# Patient Record
Sex: Female | Born: 1960 | Race: White | Hispanic: No | Smoking: Never smoker
Health system: Southern US, Community
[De-identification: ages and names within clinical notes are randomized; demographics above are authoritative.]

## PROBLEM LIST (undated history)

## (undated) DIAGNOSIS — R7303 Prediabetes: Secondary | ICD-10-CM

## (undated) DIAGNOSIS — I471 Supraventricular tachycardia, unspecified: Secondary | ICD-10-CM

## (undated) DIAGNOSIS — T7840XA Allergy, unspecified, initial encounter: Secondary | ICD-10-CM

## (undated) HISTORY — DX: Prediabetes: R73.03

## (undated) HISTORY — PX: OTHER SURGICAL HISTORY: SHX169

## (undated) HISTORY — DX: Supraventricular tachycardia, unspecified: I47.10

## (undated) HISTORY — DX: Allergy, unspecified, initial encounter: T78.40XA

## (undated) HISTORY — PX: BREAST CYST ASPIRATION: SHX578

## (undated) HISTORY — PX: TUBAL LIGATION: SHX77

---

## 2004-04-07 ENCOUNTER — Ambulatory Visit: Payer: Self-pay | Admitting: Unknown Physician Specialty

## 2004-10-01 ENCOUNTER — Ambulatory Visit: Payer: Self-pay | Admitting: Unknown Physician Specialty

## 2005-10-24 ENCOUNTER — Ambulatory Visit: Payer: Self-pay | Admitting: Unknown Physician Specialty

## 2006-11-30 ENCOUNTER — Ambulatory Visit: Payer: Self-pay | Admitting: Unknown Physician Specialty

## 2008-02-14 ENCOUNTER — Ambulatory Visit: Payer: Self-pay | Admitting: Unknown Physician Specialty

## 2009-03-26 ENCOUNTER — Ambulatory Visit: Payer: Self-pay | Admitting: Unknown Physician Specialty

## 2010-01-01 ENCOUNTER — Ambulatory Visit: Payer: Self-pay | Admitting: General Surgery

## 2010-03-29 ENCOUNTER — Ambulatory Visit: Payer: Self-pay | Admitting: Unknown Physician Specialty

## 2011-04-05 ENCOUNTER — Ambulatory Visit: Payer: Self-pay | Admitting: Unknown Physician Specialty

## 2012-09-24 ENCOUNTER — Ambulatory Visit (INDEPENDENT_AMBULATORY_CARE_PROVIDER_SITE_OTHER): Payer: BC Managed Care – PPO | Admitting: Internal Medicine

## 2012-09-24 ENCOUNTER — Encounter: Payer: Self-pay | Admitting: Internal Medicine

## 2012-09-24 VITALS — BP 100/70 | HR 67 | Temp 98.2°F | Ht 66.0 in | Wt 157.0 lb

## 2012-09-24 DIAGNOSIS — N8110 Cystocele, unspecified: Secondary | ICD-10-CM

## 2012-09-24 DIAGNOSIS — J309 Allergic rhinitis, unspecified: Secondary | ICD-10-CM

## 2012-09-24 DIAGNOSIS — Z0000 Encounter for general adult medical examination without abnormal findings: Secondary | ICD-10-CM

## 2012-09-24 DIAGNOSIS — G43909 Migraine, unspecified, not intractable, without status migrainosus: Secondary | ICD-10-CM

## 2012-09-24 DIAGNOSIS — J302 Other seasonal allergic rhinitis: Secondary | ICD-10-CM | POA: Insufficient documentation

## 2012-09-24 DIAGNOSIS — M25579 Pain in unspecified ankle and joints of unspecified foot: Secondary | ICD-10-CM

## 2012-09-24 LAB — CBC WITH DIFFERENTIAL/PLATELET
Eosinophils Absolute: 0.1 10*3/uL (ref 0.0–0.7)
Eosinophils Relative: 1 % (ref 0–5)
HCT: 35.4 % — ABNORMAL LOW (ref 36.0–46.0)
Lymphocytes Relative: 42 % (ref 12–46)
Lymphs Abs: 2 10*3/uL (ref 0.7–4.0)
MCH: 29.6 pg (ref 26.0–34.0)
MCV: 91.2 fL (ref 78.0–100.0)
Monocytes Absolute: 0.6 10*3/uL (ref 0.1–1.0)
Monocytes Relative: 13 % — ABNORMAL HIGH (ref 3–12)
Platelets: 241 10*3/uL (ref 150–400)
RBC: 3.88 MIL/uL (ref 3.87–5.11)
WBC: 4.7 10*3/uL (ref 4.0–10.5)

## 2012-09-24 LAB — MICROALBUMIN / CREATININE URINE RATIO
Creatinine,U: 114.2 mg/dL
Microalb Creat Ratio: 0.4 mg/g (ref 0.0–30.0)

## 2012-09-24 NOTE — Assessment & Plan Note (Signed)
Discussed potential referral to GYN for evaluation and possible use of pessary. Pt would like to hold off for now. Will monitor.

## 2012-09-24 NOTE — Assessment & Plan Note (Signed)
Bilateral heel pain, which is alleviated with ambulation, does not require medication. Suspect that sleep position puts stress on her heels and achilles tendon. Discussed sleeping with pillow under calves, as this has alleviated symptoms of pain in the past. Discussed using supportive footwear. Will monitor for now.

## 2012-09-24 NOTE — Assessment & Plan Note (Signed)
Symptoms well controlled with Claritin. Will continue. 

## 2012-09-24 NOTE — Assessment & Plan Note (Signed)
Symptoms of intermittent migraine headaches, well controlled with use of OTC Excederin Migraine. Will continue to monitor.

## 2012-09-24 NOTE — Progress Notes (Signed)
Subjective:    Patient ID: Claudia Dawson, female    DOB: 26-Sep-1960, 52 y.o.   MRN: [id number]  HPI 51YO female presents to establish care. She has been generally very healthy. Notes it has been a difficult year for her, as her husband passed away unexpectedly in January 17, 2014from MI. She has several concerns today.  Migraine headaches - ongoing for years, described as diffuse throbbing pressure, which occurs once every 3-4 months. Symptoms associated with photo and phonophobia. Occasional nausea. Pt takes Excedrin migraine with relief of symptoms.  Ankle pain - Occurs in the morning, resolves after moving around for 5-7 minutes. No swelling. No trauma to ankles. Able to walk several times per week without pain. Improved with using pillow under calves.  Seasonal allergies - Symptoms of clear nasal drainage and congestion. No fever, chills, cough. No watery eyes. Symptoms improved with use of Claritin.  Vaginal Prolapse - Notes pressure in vaginal area after walking long distances. Occasionally has seen prolapsed tissue. No symptoms of recurrent UTI. Has occasional urinary incontinence with stress such as sneezing. No vaginal or pelvic pain. Ongoing for several years.  Outpatient Encounter Prescriptions as of 09/24/2012  Medication Sig Dispense Refill  . b complex vitamins tablet Take 1 tablet by mouth daily.      . fish oil-omega-3 fatty acids 1000 MG capsule Take 2 g by mouth daily.      . Loratadine 10 MG CAPS Take by mouth.      . Probiotic Product (PROBIOTIC DAILY PO) Take by mouth.       No facility-administered encounter medications on file as of 09/24/2012.   BP 100/70  Pulse 67  Temp(Src) 98.2 F (36.8 C) (Oral)  Ht 5\' 6"  (1.676 m)  Wt 157 lb (71.215 kg)  BMI 25.35 kg/m2  SpO2 98%  Review of Systems  Constitutional: Negative for fever, chills, appetite change, fatigue and unexpected weight change.  HENT: Negative for ear pain, congestion, sore throat, trouble swallowing, neck  pain, voice change and sinus pressure.   Eyes: Negative for visual disturbance.  Respiratory: Negative for cough, shortness of breath, wheezing and stridor.   Cardiovascular: Negative for chest pain, palpitations and leg swelling.  Gastrointestinal: Negative for nausea, vomiting, abdominal pain, diarrhea, constipation, blood in stool, abdominal distention and anal bleeding.  Genitourinary: Negative for dysuria, flank pain, decreased urine volume, vaginal bleeding, vaginal discharge, vaginal pain and pelvic pain.  Musculoskeletal: Positive for arthralgias. Negative for myalgias and gait problem.  Skin: Negative for color change and rash.  Neurological: Positive for headaches. Negative for dizziness.  Hematological: Negative for adenopathy. Does not bruise/bleed easily.  Psychiatric/Behavioral: Negative for suicidal ideas, sleep disturbance and dysphoric mood. The patient is not nervous/anxious.        Objective:   Physical Exam  Constitutional: She is oriented to person, place, and time. She appears well-developed and well-nourished. No distress.  HENT:  Head: Normocephalic and atraumatic.  Right Ear: External ear normal.  Left Ear: External ear normal.  Nose: Nose normal.  Mouth/Throat: Oropharynx is clear and moist. No oropharyngeal exudate.  Eyes: Conjunctivae are normal. Pupils are equal, round, and reactive to light. Right eye exhibits no discharge. Left eye exhibits no discharge. No scleral icterus.  Neck: Normal range of motion. Neck supple. No tracheal deviation present. No thyromegaly present.  Cardiovascular: Normal rate, regular rhythm, normal heart sounds and intact distal pulses.  Exam reveals no gallop and no friction rub.   No murmur heard. Pulmonary/Chest: Effort normal and  breath sounds normal. No accessory muscle usage. Not tachypneic. No respiratory distress. She has no decreased breath sounds. She has no wheezes. She has no rhonchi. She has no rales. She exhibits no  tenderness.  Musculoskeletal: Normal range of motion. She exhibits no edema and no tenderness.       Right ankle: She exhibits normal range of motion and no swelling. No tenderness.       Left ankle: She exhibits normal range of motion and no swelling. No tenderness.  Lymphadenopathy:    She has no cervical adenopathy.  Neurological: She is alert and oriented to person, place, and time. No cranial nerve deficit. She exhibits normal muscle tone. Coordination normal.  Skin: Skin is warm and dry. No rash noted. She is not diaphoretic. No erythema. No pallor.  Psychiatric: She has a normal mood and affect. Her behavior is normal. Judgment and thought content normal.          Assessment & Plan:

## 2012-09-25 ENCOUNTER — Encounter: Payer: Self-pay | Admitting: *Deleted

## 2012-09-25 LAB — LIPID PANEL
LDL Cholesterol: 113 mg/dL — ABNORMAL HIGH (ref 0–99)
Total CHOL/HDL Ratio: 3.1 Ratio
VLDL: 23 mg/dL (ref 0–40)

## 2012-09-25 LAB — COMPREHENSIVE METABOLIC PANEL
ALT: 18 U/L (ref 0–35)
Alkaline Phosphatase: 68 U/L (ref 39–117)
CO2: 24 mEq/L (ref 19–32)
Creat: 0.84 mg/dL (ref 0.50–1.10)
Glucose, Bld: 90 mg/dL (ref 70–99)
Sodium: 140 mEq/L (ref 135–145)
Total Bilirubin: 0.4 mg/dL (ref 0.3–1.2)
Total Protein: 7.3 g/dL (ref 6.0–8.3)

## 2012-09-25 LAB — HEMOGLOBIN A1C: Mean Plasma Glucose: 114 mg/dL (ref <117)

## 2012-09-26 ENCOUNTER — Ambulatory Visit: Payer: Self-pay | Admitting: Internal Medicine

## 2012-09-28 ENCOUNTER — Encounter: Payer: Self-pay | Admitting: Internal Medicine

## 2012-10-03 ENCOUNTER — Telehealth: Payer: Self-pay | Admitting: Internal Medicine

## 2012-10-03 NOTE — Telephone Encounter (Signed)
Patient informed and she will have her pap done when some comes in September

## 2012-10-03 NOTE — Telephone Encounter (Signed)
PAP from 2012 from [facility] showed ASCUS and HPV neg. Has this been repeated? If not, we need to schedule repeat PAP here.

## 2012-10-03 NOTE — Telephone Encounter (Signed)
Left message to cal back 

## 2012-10-31 ENCOUNTER — Encounter: Payer: BC Managed Care – PPO | Admitting: Internal Medicine

## 2012-11-06 ENCOUNTER — Other Ambulatory Visit (HOSPITAL_COMMUNITY)
Admission: RE | Admit: 2012-11-06 | Discharge: 2012-11-06 | Disposition: A | Discharge status: Home / Self Care | Payer: BC Managed Care – PPO | Source: Ambulatory Visit | Attending: Internal Medicine | Admitting: Internal Medicine

## 2012-11-06 ENCOUNTER — Ambulatory Visit (INDEPENDENT_AMBULATORY_CARE_PROVIDER_SITE_OTHER): Payer: BC Managed Care – PPO | Admitting: Internal Medicine

## 2012-11-06 ENCOUNTER — Encounter: Payer: Self-pay | Admitting: Internal Medicine

## 2012-11-06 VITALS — BP 100/70 | HR 65 | Temp 98.4°F | Ht 66.0 in | Wt 160.0 lb

## 2012-11-06 DIAGNOSIS — Z01419 Encounter for gynecological examination (general) (routine) without abnormal findings: Secondary | ICD-10-CM | POA: Insufficient documentation

## 2012-11-06 DIAGNOSIS — Z124 Encounter for screening for malignant neoplasm of cervix: Secondary | ICD-10-CM

## 2012-11-06 DIAGNOSIS — Z0000 Encounter for general adult medical examination without abnormal findings: Secondary | ICD-10-CM

## 2012-11-06 DIAGNOSIS — Z1151 Encounter for screening for human papillomavirus (HPV): Secondary | ICD-10-CM | POA: Insufficient documentation

## 2012-11-07 DIAGNOSIS — Z124 Encounter for screening for malignant neoplasm of cervix: Secondary | ICD-10-CM | POA: Insufficient documentation

## 2012-11-07 DIAGNOSIS — Z0000 Encounter for general adult medical examination without abnormal findings: Secondary | ICD-10-CM | POA: Insufficient documentation

## 2012-11-07 NOTE — Assessment & Plan Note (Addendum)
General medical exam including breast and pelvic exam normal today. Pap is pending. Encourage continued efforts at healthy diet and regular physical activity. Mammogram is up-to-date. Colonoscopy up-to-date. Reviewed recent labs which were normal. Followup one year or sooner as needed.

## 2012-11-07 NOTE — Progress Notes (Signed)
Subjective:    Patient ID: Claudia Dawson, female    DOB: Jul 14, 1960, 52 y.o.   MRN: [id number]  HPI 52 year old female presents for annual exam. She reports she is feeling well. She continues to follow a healthy diet and gets regular physical activity. She is up-to-date on mammogram and colonoscopy. She denies any concerns today. She notes that she may be a kidney donor for her cousin who has cystic fibrosis and kidney failure.  Outpatient Encounter Prescriptions as of 11/06/2012  Medication Sig Dispense Refill  . b complex vitamins tablet Take 1 tablet by mouth daily.      . fish oil-omega-3 fatty acids 1000 MG capsule Take 2 g by mouth daily.      . Loratadine 10 MG CAPS Take by mouth.       No facility-administered encounter medications on file as of 11/06/2012.   BP 100/70  Pulse 65  Temp(Src) 98.4 F (36.9 C) (Oral)  Ht 5\' 6"  (1.676 m)  Wt 160 lb (72.576 kg)  BMI 25.84 kg/m2  SpO2 97%  Review of Systems  Constitutional: Negative for fever, chills, appetite change, fatigue and unexpected weight change.  HENT: Negative for ear pain, congestion, sore throat, trouble swallowing, neck pain, voice change and sinus pressure.   Eyes: Negative for visual disturbance.  Respiratory: Negative for cough, shortness of breath, wheezing and stridor.   Cardiovascular: Negative for chest pain, palpitations and leg swelling.  Gastrointestinal: Negative for nausea, vomiting, abdominal pain, diarrhea, constipation, blood in stool, abdominal distention and anal bleeding.  Genitourinary: Negative for dysuria and flank pain.  Musculoskeletal: Negative for myalgias, arthralgias and gait problem.  Skin: Negative for color change and rash.  Neurological: Negative for dizziness and headaches.  Hematological: Negative for adenopathy. Does not bruise/bleed easily.  Psychiatric/Behavioral: Negative for suicidal ideas, sleep disturbance and dysphoric mood. The patient is not nervous/anxious.         Objective:   Physical Exam  Constitutional: She is oriented to person, place, and time. She appears well-developed and well-nourished. No distress.  HENT:  Head: Normocephalic and atraumatic.  Right Ear: External ear normal.  Left Ear: External ear normal.  Nose: Nose normal.  Mouth/Throat: Oropharynx is clear and moist. No oropharyngeal exudate.  Eyes: Conjunctivae are normal. Pupils are equal, round, and reactive to light. Right eye exhibits no discharge. Left eye exhibits no discharge. No scleral icterus.  Neck: Normal range of motion. Neck supple. No tracheal deviation present. No thyromegaly present.  Cardiovascular: Normal rate, regular rhythm, normal heart sounds and intact distal pulses.  Exam reveals no gallop and no friction rub.   No murmur heard. Pulmonary/Chest: Effort normal and breath sounds normal. No accessory muscle usage. Not tachypneic. No respiratory distress. She has no decreased breath sounds. She has no wheezes. She has no rhonchi. She has no rales. She exhibits no tenderness.  Abdominal: Soft. Bowel sounds are normal. She exhibits no distension and no mass. There is no tenderness. There is no rebound and no guarding.  Genitourinary: Rectum normal, vagina normal and uterus normal. No breast swelling, tenderness, discharge or bleeding. Pelvic exam was performed with patient supine. There is no rash, tenderness or lesion on the right labia. There is no rash, tenderness or lesion on the left labia. Uterus is not enlarged and not tender. Cervix exhibits no motion tenderness, no discharge and no friability. Right adnexum displays no mass, no tenderness and no fullness. Left adnexum displays no mass, no tenderness and no fullness. No erythema or tenderness  around the vagina. No vaginal discharge found.  Musculoskeletal: Normal range of motion. She exhibits no edema and no tenderness.  Lymphadenopathy:    She has no cervical adenopathy.  Neurological: She is alert and oriented  to person, place, and time. No cranial nerve deficit. She exhibits normal muscle tone. Coordination normal.  Skin: Skin is warm and dry. No rash noted. She is not diaphoretic. No erythema. No pallor.  Psychiatric: She has a normal mood and affect. Her behavior is normal. Judgment and thought content normal.          Assessment & Plan:

## 2012-11-08 LAB — HM PAP SMEAR: HM PAP: NEGATIVE

## 2012-12-20 ENCOUNTER — Other Ambulatory Visit: Payer: Self-pay

## 2013-11-08 ENCOUNTER — Encounter: Payer: Self-pay | Admitting: Internal Medicine

## 2013-11-08 ENCOUNTER — Ambulatory Visit: Payer: Self-pay | Admitting: Internal Medicine

## 2013-11-08 ENCOUNTER — Ambulatory Visit (INDEPENDENT_AMBULATORY_CARE_PROVIDER_SITE_OTHER): Payer: BC Managed Care – PPO | Admitting: Internal Medicine

## 2013-11-08 VITALS — BP 100/56 | HR 69 | Temp 98.1°F | Ht 67.0 in | Wt 166.5 lb

## 2013-11-08 DIAGNOSIS — Z23 Encounter for immunization: Secondary | ICD-10-CM

## 2013-11-08 DIAGNOSIS — N63 Unspecified lump in unspecified breast: Secondary | ICD-10-CM | POA: Insufficient documentation

## 2013-11-08 DIAGNOSIS — Z0000 Encounter for general adult medical examination without abnormal findings: Secondary | ICD-10-CM

## 2013-11-08 DIAGNOSIS — R928 Other abnormal and inconclusive findings on diagnostic imaging of breast: Secondary | ICD-10-CM

## 2013-11-08 HISTORY — DX: Unspecified lump in unspecified breast: N63.0

## 2013-11-08 LAB — MICROALBUMIN / CREATININE URINE RATIO
Creatinine,U: 37 mg/dL
MICROALB UR: 0.2 mg/dL (ref 0.0–1.9)
MICROALB/CREAT RATIO: 0.5 mg/g (ref 0.0–30.0)

## 2013-11-08 LAB — LIPID PANEL
CHOL/HDL RATIO: 3
Cholesterol: 216 mg/dL — ABNORMAL HIGH (ref 0–200)
HDL: 62.3 mg/dL (ref >39.00)
LDL Cholesterol: 133 mg/dL — ABNORMAL HIGH (ref 0–99)
NONHDL: 153.7
Triglycerides: 102 mg/dL (ref 0.0–149.0)
VLDL: 20.4 mg/dL (ref 0.0–40.0)

## 2013-11-08 LAB — COMPREHENSIVE METABOLIC PANEL
ALT: 20 U/L (ref 0–35)
AST: 20 U/L (ref 0–37)
Albumin: 4.1 g/dL (ref 3.5–5.2)
Alkaline Phosphatase: 75 U/L (ref 39–117)
BUN: 13 mg/dL (ref 6–23)
CALCIUM: 9.4 mg/dL (ref 8.4–10.5)
CHLORIDE: 105 meq/L (ref 96–112)
CO2: 27 meq/L (ref 19–32)
Creatinine, Ser: 0.8 mg/dL (ref 0.4–1.2)
GFR: 78.34 mL/min (ref >60.00)
Glucose, Bld: 91 mg/dL (ref 70–99)
Potassium: 4.1 mEq/L (ref 3.5–5.1)
SODIUM: 139 meq/L (ref 135–145)
TOTAL PROTEIN: 7.1 g/dL (ref 6.0–8.3)
Total Bilirubin: 0.7 mg/dL (ref 0.2–1.2)

## 2013-11-08 LAB — CBC WITH DIFFERENTIAL/PLATELET
BASOS ABS: 0 10*3/uL (ref 0.0–0.1)
Basophils Relative: 0.5 % (ref 0.0–3.0)
EOS ABS: 0.1 10*3/uL (ref 0.0–0.7)
Eosinophils Relative: 1 % (ref 0.0–5.0)
HCT: 38.9 % (ref 36.0–46.0)
Hemoglobin: 12.8 g/dL (ref 12.0–15.0)
LYMPHS PCT: 39.5 % (ref 12.0–46.0)
Lymphs Abs: 2.1 10*3/uL (ref 0.7–4.0)
MCHC: 32.9 g/dL (ref 30.0–36.0)
MCV: 92.2 fl (ref 78.0–100.0)
MONOS PCT: 11.4 % (ref 3.0–12.0)
Monocytes Absolute: 0.6 10*3/uL (ref 0.1–1.0)
NEUTROS PCT: 47.6 % (ref 43.0–77.0)
Neutro Abs: 2.6 10*3/uL (ref 1.4–7.7)
PLATELETS: 217 10*3/uL (ref 150.0–400.0)
RBC: 4.22 Mil/uL (ref 3.87–5.11)
RDW: 13.5 % (ref 11.5–15.5)
WBC: 5.4 10*3/uL (ref 4.0–10.5)

## 2013-11-08 LAB — TSH: TSH: 1.66 u[IU]/mL (ref 0.35–4.50)

## 2013-11-08 LAB — HEMOGLOBIN A1C: Hgb A1c MFr Bld: 5.9 % (ref 4.6–6.5)

## 2013-11-08 LAB — VITAMIN D 25 HYDROXY (VIT D DEFICIENCY, FRACTURES): VITD: 27.8 ng/mL — AB (ref 30.00–100.00)

## 2013-11-08 NOTE — Patient Instructions (Signed)

## 2013-11-08 NOTE — Addendum Note (Signed)
Addended by: Marchia Meiers on: 11/08/2013 09:59 AM   Modules accepted: Orders

## 2013-11-08 NOTE — Assessment & Plan Note (Signed)
Pt with left breast nodular area. Reports this has been followed by Dr. Lemar Livings and aspirated in the past. Follow up mammogram scheduled for today.

## 2013-11-08 NOTE — Progress Notes (Signed)
Subjective:    Patient ID: Claudia Dawson, female    DOB: 1960-05-26, 53 y.o.   MRN: [id number]  HPI 52YO female presents for annual exam. Trying to lose weight following a healthy diet and exercising with cardio and weights. Recently started a new exercise program through church. Very active with walking and biking. Back at work in school. She notes that nodular area in left breast has been stable for several years. S/p aspiration by Dr. Lemar Livings in the past.  Review of Systems  Constitutional: Negative for fever, chills, appetite change, fatigue and unexpected weight change.  HENT: Positive for congestion.   Eyes: Negative for visual disturbance.  Respiratory: Negative for shortness of breath.   Cardiovascular: Negative for chest pain and leg swelling.  Gastrointestinal: Negative for nausea, vomiting, abdominal pain, diarrhea, constipation and rectal pain.  Skin: Negative for color change and rash.  Hematological: Negative for adenopathy. Does not bruise/bleed easily.  Psychiatric/Behavioral: Negative for dysphoric mood. The patient is not nervous/anxious.        Objective:    BP 100/56  Pulse 69  Temp(Src) 98.1 F (36.7 C) (Oral)  Ht  (1.702 m)  Wt 166 lb 8 oz (75.524 kg)  BMI 26.07 kg/m2  SpO2 97% Physical Exam  Constitutional: She is oriented to person, place, and time. She appears well-developed and well-nourished. No distress.  HENT:  Head: Normocephalic and atraumatic.  Right Ear: External ear normal.  Left Ear: External ear normal.  Nose: Nose normal.  Mouth/Throat: Oropharynx is clear and moist. No oropharyngeal exudate.  Eyes: Conjunctivae are normal. Pupils are equal, round, and reactive to light. Right eye exhibits no discharge. Left eye exhibits no discharge. No scleral icterus.  Neck: Normal range of motion. Neck supple. No tracheal deviation present. No thyromegaly present.  Cardiovascular: Normal rate, regular rhythm, normal heart sounds  and intact distal pulses.  Exam reveals no gallop and no friction rub.   No murmur heard. Pulmonary/Chest: Effort normal and breath sounds normal. No accessory muscle usage. Not tachypneic. No respiratory distress. She has no decreased breath sounds. She has no wheezes. She has no rales. She exhibits no tenderness. Right breast exhibits no inverted nipple, no mass, no nipple discharge, no skin change and no tenderness. Left breast exhibits mass. Left breast exhibits no inverted nipple, no nipple discharge, no skin change and no tenderness. Breasts are symmetrical.    Abdominal: Soft. Bowel sounds are normal. She exhibits no distension and no mass. There is no tenderness. There is no rebound and no guarding.  Musculoskeletal: Normal range of motion. She exhibits no edema and no tenderness.  Lymphadenopathy:    She has no cervical adenopathy.  Neurological: She is alert and oriented to person, place, and time. No cranial nerve deficit. She exhibits normal muscle tone. Coordination normal.  Skin: Skin is warm and dry. No rash noted. She is not diaphoretic. No erythema. No pallor.  Psychiatric: She has a normal mood and affect. Her behavior is normal. Judgment and thought content normal.          Assessment & Plan:   Problem List Items Addressed This Visit     Unprioritized   Breast nodule     Pt with left breast nodular area. Reports this has been followed by Dr. Lemar Livings and aspirated in the past. Follow up mammogram scheduled for today.     Routine general medical examination at a health care facility - Primary     General medical exam normal  today except as noted on breast exam. PAP and pelvic deferred as PAP normal, HPV neg in 2014. Plan repeat PAP in 2017. Mammogram scheduled for today. Colonoscopy UTD 2012, will request report on this. Labs today including CBC, CMP, lipids, TSH, A1c. Encouraged healthy diet and exercise. Flu and TdaP vaccines today.    Relevant Orders      TSH       CBC with Differential      Comprehensive metabolic panel      Lipid panel      Microalbumin / creatinine urine ratio      Vit D  25 hydroxy (rtn osteoporosis monitoring)      Hemoglobin A1c       Return in about 1 year (around 11/09/2014) for Physical.

## 2013-11-08 NOTE — Progress Notes (Signed)
Pre visit review using our clinic review tool, if applicable. No additional management support is needed unless otherwise documented below in the visit note. 

## 2013-11-08 NOTE — Assessment & Plan Note (Addendum)
General medical exam normal today except as noted on breast exam. PAP and pelvic deferred as PAP normal, HPV neg in 2014. Plan repeat PAP in 2017. Mammogram scheduled for today. Colonoscopy UTD 2012, will request report on this. Labs today including CBC, CMP, lipids, TSH, A1c. Encouraged healthy diet and exercise. Flu and TdaP vaccines today.

## 2013-11-09 ENCOUNTER — Telehealth: Payer: Self-pay | Admitting: Internal Medicine

## 2013-11-09 NOTE — Telephone Encounter (Signed)
Results from [facility]ogram showed possible density in left breast. They have requested additional views. Has this been scheduled?

## 2013-11-12 NOTE — Telephone Encounter (Signed)
Left message for pt to return my call.

## 2013-11-12 NOTE — Telephone Encounter (Signed)
Pt has spoke with [facility] and has appt set with them

## 2013-11-27 ENCOUNTER — Ambulatory Visit: Payer: Self-pay | Admitting: Internal Medicine

## 2013-12-11 ENCOUNTER — Encounter: Payer: Self-pay | Admitting: Internal Medicine

## 2013-12-26 ENCOUNTER — Encounter: Payer: Self-pay | Admitting: Internal Medicine

## 2015-01-07 ENCOUNTER — Telehealth: Payer: Self-pay | Admitting: Internal Medicine

## 2015-01-07 DIAGNOSIS — Z0000 Encounter for general adult medical examination without abnormal findings: Secondary | ICD-10-CM

## 2015-01-07 NOTE — Telephone Encounter (Signed)
Pt would like to get lab work done before her physical appt on 02/12/2015. Orders needed please and Thank You!

## 2015-02-12 ENCOUNTER — Ambulatory Visit (INDEPENDENT_AMBULATORY_CARE_PROVIDER_SITE_OTHER): Payer: BC Managed Care – PPO | Admitting: Internal Medicine

## 2015-02-12 ENCOUNTER — Encounter: Payer: Self-pay | Admitting: Internal Medicine

## 2015-02-12 VITALS — BP 109/66 | HR 58 | Temp 98.0°F | Ht 65.75 in | Wt 156.0 lb

## 2015-02-12 DIAGNOSIS — Z23 Encounter for immunization: Secondary | ICD-10-CM

## 2015-02-12 DIAGNOSIS — Z0000 Encounter for general adult medical examination without abnormal findings: Secondary | ICD-10-CM

## 2015-02-12 DIAGNOSIS — Z1239 Encounter for other screening for malignant neoplasm of breast: Secondary | ICD-10-CM | POA: Insufficient documentation

## 2015-02-12 LAB — CBC WITH DIFFERENTIAL/PLATELET
BASOS ABS: 0 10*3/uL (ref 0.0–0.1)
Basophils Relative: 0.3 % (ref 0.0–3.0)
EOS ABS: 0 10*3/uL (ref 0.0–0.7)
Eosinophils Relative: 0.8 % (ref 0.0–5.0)
HEMATOCRIT: 38.3 % (ref 36.0–46.0)
Hemoglobin: 12.6 g/dL (ref 12.0–15.0)
LYMPHS PCT: 46.7 % — AB (ref 12.0–46.0)
Lymphs Abs: 2.1 10*3/uL (ref 0.7–4.0)
MCHC: 32.8 g/dL (ref 30.0–36.0)
MCV: 90 fl (ref 78.0–100.0)
Monocytes Absolute: 0.5 10*3/uL (ref 0.1–1.0)
Monocytes Relative: 10.4 % (ref 3.0–12.0)
NEUTROS ABS: 1.9 10*3/uL (ref 1.4–7.7)
Neutrophils Relative %: 41.8 % — ABNORMAL LOW (ref 43.0–77.0)
PLATELETS: 227 10*3/uL (ref 150.0–400.0)
RBC: 4.26 Mil/uL (ref 3.87–5.11)
RDW: 14.1 % (ref 11.5–15.5)
WBC: 4.5 10*3/uL (ref 4.0–10.5)

## 2015-02-12 LAB — COMPREHENSIVE METABOLIC PANEL
ALT: 17 U/L (ref 0–35)
AST: 18 U/L (ref 0–37)
Albumin: 4.1 g/dL (ref 3.5–5.2)
Alkaline Phosphatase: 75 U/L (ref 39–117)
BILIRUBIN TOTAL: 0.5 mg/dL (ref 0.2–1.2)
BUN: 13 mg/dL (ref 6–23)
CALCIUM: 9.2 mg/dL (ref 8.4–10.5)
CO2: 30 meq/L (ref 19–32)
CREATININE: 0.65 mg/dL (ref 0.40–1.20)
Chloride: 103 mEq/L (ref 96–112)
GFR: 100.52 mL/min (ref >60.00)
GLUCOSE: 91 mg/dL (ref 70–99)
Potassium: 4.4 mEq/L (ref 3.5–5.1)
Sodium: 139 mEq/L (ref 135–145)
TOTAL PROTEIN: 7 g/dL (ref 6.0–8.3)

## 2015-02-12 LAB — LIPID PANEL
CHOL/HDL RATIO: 4
Cholesterol: 191 mg/dL (ref 0–200)
HDL: 53.9 mg/dL (ref >39.00)
LDL Cholesterol: 111 mg/dL — ABNORMAL HIGH (ref 0–99)
NONHDL: 137.51
TRIGLYCERIDES: 132 mg/dL (ref 0.0–149.0)
VLDL: 26.4 mg/dL (ref 0.0–40.0)

## 2015-02-12 LAB — VITAMIN D 25 HYDROXY (VIT D DEFICIENCY, FRACTURES): VITD: 28.17 ng/mL — ABNORMAL LOW (ref 30.00–100.00)

## 2015-02-12 LAB — TSH: TSH: 1.43 u[IU]/mL (ref 0.35–4.50)

## 2015-02-12 NOTE — Progress Notes (Signed)
Subjective:    Patient ID: Rolly Pancakeindy Carroll Yingst, female    DOB: 10-18-1960, 54 y.o.   MRN: [id number]  HPI  54YO female presents for annual exam.  Recently sick with congestion, fever. Lasted about 24hr, then developed hoarseness. Taking Mucinex with improvement.  Aside from this, feeling well.   She continues to have some intermittent vaginal prolapse, which occurs with heavy lifting. She does not find this bothersome. She is able to apply pressure to push vaginal wall back internally. She prefers to hold off on considering pessary or surgical intervention.  Wt Readings from Last 3 Encounters:  02/12/15 156 lb (70.761 kg)  11/08/13 166 lb 8 oz (75.524 kg)  11/06/12 160 lb (72.576 kg)   BP Readings from Last 3 Encounters:  02/12/15 109/66  11/08/13 100/56  11/06/12 100/70    Past Medical History  Diagnosis Date  . Allergy    Family History  Problem Relation Age of Onset  . Hypertension Father   . Atrial fibrillation Father   . Heart disease Father     afib  . Ulcers Father   . Cancer Maternal Grandmother     Endometrial  . Stroke Paternal Grandmother   . Cancer Maternal Aunt     half-aunt breast cancer  . Cancer Brother     kidney, unsure if malignant tumor   Past Surgical History  Procedure Laterality Date  . Cesarean section    . Tubal ligation     Social History   Social History  . Marital Status: Married    Spouse Name: N/A  . Number of Children: N/A  . Years of Education: N/A   Social History Main Topics  . Smoking status: Never Smoker   . Smokeless tobacco: Never Used  . Alcohol Use: No  . Drug Use: No  . Sexual Activity: Not Asked   Other Topics Concern  . None   Social History Narrative   [place] in [place]. Husband died of MI at age 55 in Jan 2014. Daughters live nearby.      Work - Geologist, engineeringteacher assistant      Diet - regular diet      Exercise - walks and lifts weights with group from church    Review of Systems    Constitutional: Negative for fever, chills, appetite change, fatigue and unexpected weight change.  HENT: Positive for congestion, postnasal drip, rhinorrhea and voice change. Negative for ear pain, sore throat and trouble swallowing.   Eyes: Negative for visual disturbance.  Respiratory: Negative for cough, chest tightness and shortness of breath.   Cardiovascular: Negative for chest pain and leg swelling.  Gastrointestinal: Negative for nausea, vomiting, abdominal pain, diarrhea and constipation.  Musculoskeletal: Negative for myalgias and arthralgias.  Skin: Negative for color change and rash.  Hematological: Negative for adenopathy. Does not bruise/bleed easily.  Psychiatric/Behavioral: Negative for sleep disturbance and dysphoric mood. The patient is not nervous/anxious.        Objective:    BP 109/66 mmHg  Pulse 58  Temp(Src) 98 F (36.7 C) (Oral)  Ht 5' 5.75" (1.67 m)  Wt 156 lb (70.761 kg)  BMI 25.37 kg/m2  SpO2 100% Physical Exam  Constitutional: She is oriented to person, place, and time. She appears well-developed and well-nourished. No distress.  HENT:  Head: Normocephalic and atraumatic.  Right Ear: External ear normal.  Left Ear: External ear normal.  Nose: Nose normal.  Mouth/Throat: Oropharynx is clear and moist. No oropharyngeal exudate.  Eyes: Conjunctivae are  normal. Pupils are equal, round, and reactive to light. Right eye exhibits no discharge. Left eye exhibits no discharge. No scleral icterus.  Neck: Normal range of motion. Neck supple. No tracheal deviation present. No thyromegaly present.  Cardiovascular: Normal rate, regular rhythm, normal heart sounds and intact distal pulses.  Exam reveals no gallop and no friction rub.   No murmur heard. Pulmonary/Chest: Effort normal and breath sounds normal. No accessory muscle usage. No tachypnea. No respiratory distress. She has no decreased breath sounds. She has no wheezes. She has no rales. She exhibits no  tenderness. Right breast exhibits no inverted nipple, no mass, no nipple discharge, no skin change and no tenderness. Left breast exhibits no inverted nipple, no mass, no nipple discharge, no skin change and no tenderness. Breasts are symmetrical.  Abdominal: Soft. Bowel sounds are normal. She exhibits no distension and no mass. There is no tenderness. There is no rebound and no guarding.  Musculoskeletal: Normal range of motion. She exhibits no edema or tenderness.  Lymphadenopathy:    She has no cervical adenopathy.  Neurological: She is alert and oriented to person, place, and time. No cranial nerve deficit. She exhibits normal muscle tone. Coordination normal.  Skin: Skin is warm and dry. No rash noted. She is not diaphoretic. No erythema. No pallor.  Psychiatric: She has a normal mood and affect. Her behavior is normal. Judgment and thought content normal.          Assessment & Plan:   Problem List Items Addressed This Visit      Unprioritized   Routine general medical examination at a health care facility - Primary    General medical exam normal today including breast exam. PAP and pelvic deferred as normal, HPV neg in 2014, plan repeat in 2017. Mammogram ordered. Colonoscopy UTD. Labs ordered.  Flu vaccine today. Encouraged healthy diet and exercise.      Relevant Orders   CBC with Differential/Platelet   Comprehensive metabolic panel   Lipid panel   VITAMIN D 25 Hydroxy (Vit-D Deficiency, Fractures)   TSH   Hepatitis C antibody   Screening for breast cancer   Relevant Orders   MM Digital Screening       Return in about 1 year (around 02/12/2016) for Physical.

## 2015-02-12 NOTE — Assessment & Plan Note (Signed)
General medical exam normal today including breast exam. PAP and pelvic deferred as normal, HPV neg in 2014, plan repeat in 2017. Mammogram ordered. Colonoscopy UTD. Labs ordered.  Flu vaccine today. Encouraged healthy diet and exercise.

## 2015-02-12 NOTE — Patient Instructions (Signed)
Health Maintenance, Female Adopting a healthy lifestyle and getting preventive care can go a long way to promote health and wellness. Talk with your health care provider about what schedule of regular examinations is right for you. This is a good chance for you to check in with your provider about disease prevention and staying healthy. In between checkups, there are plenty of things you can do on your own. Experts have done a lot of research about which lifestyle changes and preventive measures are most likely to keep you healthy. Ask your health care provider for more information. WEIGHT AND DIET  Eat a healthy diet  Be sure to include plenty of vegetables, fruits, low-fat dairy products, and lean protein.  Do not eat a lot of foods high in solid fats, added sugars, or salt.  Get regular exercise. This is one of the most important things you can do for your health.  Most adults should exercise for at least 150 minutes each week. The exercise should increase your heart rate and make you sweat (moderate-intensity exercise).  Most adults should also do strengthening exercises at least twice a week. This is in addition to the moderate-intensity exercise.  Maintain a healthy weight  Body mass index (BMI) is a measurement that can be used to identify possible weight problems. It estimates body fat based on height and weight. Your health care provider can help determine your BMI and help you achieve or maintain a healthy weight.  For females 20 years of age and older:   A BMI below 18.5 is considered underweight.  A BMI of 18.5 to 24.9 is normal.  A BMI of 25 to 29.9 is considered overweight.  A BMI of 30 and above is considered obese.  Watch levels of cholesterol and blood lipids  You should start having your blood tested for lipids and cholesterol at 55 years of age, then have this test every 5 years.  You may need to have your cholesterol levels checked more often if:  Your lipid  or cholesterol levels are high.  You are older than 55 years of age.  You are at high risk for heart disease.  CANCER SCREENING   Lung Cancer  Lung cancer screening is recommended for adults 55-80 years old who are at high risk for lung cancer because of a history of smoking.  A yearly low-dose CT scan of the lungs is recommended for people who:  Currently smoke.  Have quit within the past 15 years.  Have at least a 30-pack-year history of smoking. A pack year is smoking an average of one pack of cigarettes a day for 1 year.  Yearly screening should continue until it has been 15 years since you quit.  Yearly screening should stop if you develop a health problem that would prevent you from having lung cancer treatment.  Breast Cancer  Practice breast self-awareness. This means understanding how your breasts normally appear and feel.  It also means doing regular breast self-exams. Let your health care provider know about any changes, no matter how small.  If you are in your 20s or 30s, you should have a clinical breast exam (CBE) by a health care provider every 1-3 years as part of a regular health exam.  If you are 40 or older, have a CBE every year. Also consider having a breast X-ray (mammogram) every year.  If you have a family history of breast cancer, talk to your health care provider about genetic screening.  If you   are at high risk for breast cancer, talk to your health care provider about having an MRI and a mammogram every year.  Breast cancer gene (BRCA) assessment is recommended for women who have family members with BRCA-related cancers. BRCA-related cancers include:  Breast.  Ovarian.  Tubal.  Peritoneal cancers.  Results of the assessment will determine the need for genetic counseling and BRCA1 and BRCA2 testing. Cervical Cancer Your health care provider may recommend that you be screened regularly for cancer of the pelvic organs (ovaries, uterus, and  vagina). This screening involves a pelvic examination, including checking for microscopic changes to the surface of your cervix (Pap test). You may be encouraged to have this screening done every 3 years, beginning at age 21.  For women ages 30-65, health care providers may recommend pelvic exams and Pap testing every 3 years, or they may recommend the Pap and pelvic exam, combined with testing for human papilloma virus (HPV), every 5 years. Some types of HPV increase your risk of cervical cancer. Testing for HPV may also be done on women of any age with unclear Pap test results.  Other health care providers may not recommend any screening for nonpregnant women who are considered low risk for pelvic cancer and who do not have symptoms. Ask your health care provider if a screening pelvic exam is right for you.  If you have had past treatment for cervical cancer or a condition that could lead to cancer, you need Pap tests and screening for cancer for at least 20 years after your treatment. If Pap tests have been discontinued, your risk factors (such as having a new sexual partner) need to be reassessed to determine if screening should resume. Some women have medical problems that increase the chance of getting cervical cancer. In these cases, your health care provider may recommend more frequent screening and Pap tests. Colorectal Cancer  This type of cancer can be detected and often prevented.  Routine colorectal cancer screening usually begins at 55 years of age and continues through 55 years of age.  Your health care provider may recommend screening at an earlier age if you have risk factors for colon cancer.  Your health care provider may also recommend using home test kits to check for hidden blood in the stool.  A small camera at the end of a tube can be used to examine your colon directly (sigmoidoscopy or colonoscopy). This is done to check for the earliest forms of colorectal  cancer.  Routine screening usually begins at age 50.  Direct examination of the colon should be repeated every 5-10 years through 55 years of age. However, you may need to be screened more often if early forms of precancerous polyps or small growths are found. Skin Cancer  Check your skin from head to toe regularly.  Tell your health care provider about any new moles or changes in moles, especially if there is a change in a mole's shape or color.  Also tell your health care provider if you have a mole that is larger than the size of a pencil eraser.  Always use sunscreen. Apply sunscreen liberally and repeatedly throughout the day.  Protect yourself by wearing long sleeves, pants, a wide-brimmed hat, and sunglasses whenever you are outside. HEART DISEASE, DIABETES, AND HIGH BLOOD PRESSURE   High blood pressure causes heart disease and increases the risk of stroke. High blood pressure is more likely to develop in:  People who have blood pressure in the high end   of the normal range (130-139/85-89 mm Hg).  People who are overweight or obese.  People who are African American.  If you are 38-23 years of age, have your blood pressure checked every 3-5 years. If you are 61 years of age or older, have your blood pressure checked every year. You should have your blood pressure measured twice--once when you are at a hospital or clinic, and once when you are not at a hospital or clinic. Record the average of the two measurements. To check your blood pressure when you are not at a hospital or clinic, you can use:  An automated blood pressure machine at a pharmacy.  A home blood pressure monitor.  If you are between 45 years and 39 years old, ask your health care provider if you should take aspirin to prevent strokes.  Have regular diabetes screenings. This involves taking a blood sample to check your fasting blood sugar level.  If you are at a normal weight and have a low risk for diabetes,  have this test once every three years after 55 years of age.  If you are overweight and have a high risk for diabetes, consider being tested at a younger age or more often. PREVENTING INFECTION  Hepatitis B  If you have a higher risk for hepatitis B, you should be screened for this virus. You are considered at high risk for hepatitis B if:  You were born in a country where hepatitis B is common. Ask your health care provider which countries are considered high risk.  Your parents were born in a high-risk country, and you have not been immunized against hepatitis B (hepatitis B vaccine).  You have HIV or AIDS.  You use needles to inject street drugs.  You live with someone who has hepatitis B.  You have had sex with someone who has hepatitis B.  You get hemodialysis treatment.  You take certain medicines for conditions, including cancer, organ transplantation, and autoimmune conditions. Hepatitis C  Blood testing is recommended for:  Everyone born from [birth year] through 1966.  Anyone with known risk factors for hepatitis C. Sexually transmitted infections (STIs)  You should be screened for sexually transmitted infections (STIs) including gonorrhea and chlamydia if:  You are sexually active and are younger than 55 years of age.  You are older than 55 years of age and your health care provider tells you that you are at risk for this type of infection.  Your sexual activity has changed since you were last screened and you are at an increased risk for chlamydia or gonorrhea. Ask your health care provider if you are at risk.  If you do not have HIV, but are at risk, it may be recommended that you take a prescription medicine daily to prevent HIV infection. This is called pre-exposure prophylaxis (PrEP). You are considered at risk if:  You are sexually active and do not regularly use condoms or know the HIV status of your partner(s).  You take drugs by injection.  You are sexually  active with a partner who has HIV. Talk with your health care provider about whether you are at high risk of being infected with HIV. If you choose to begin PrEP, you should first be tested for HIV. You should then be tested every 3 months for as long as you are taking PrEP.  PREGNANCY   If you are premenopausal and you may become pregnant, ask your health care provider about preconception counseling.  If you may  become pregnant, take 400 to 800 micrograms (mcg) of folic acid every day.  If you want to prevent pregnancy, talk to your health care provider about birth control (contraception). OSTEOPOROSIS AND MENOPAUSE   Osteoporosis is a disease in which the bones lose minerals and strength with aging. This can result in serious bone fractures. Your risk for osteoporosis can be identified using a bone density scan.  If you are 61 years of age or older, or if you are at risk for osteoporosis and fractures, ask your health care provider if you should be screened.  Ask your health care provider whether you should take a calcium or vitamin D supplement to lower your risk for osteoporosis.  Menopause may have certain physical symptoms and risks.  Hormone replacement therapy may reduce some of these symptoms and risks. Talk to your health care provider about whether hormone replacement therapy is right for you.  HOME CARE INSTRUCTIONS   Schedule regular health, dental, and eye exams.  Stay current with your immunizations.   Do not use any tobacco products including cigarettes, chewing tobacco, or electronic cigarettes.  If you are pregnant, do not drink alcohol.  If you are breastfeeding, limit how much and how often you drink alcohol.  Limit alcohol intake to no more than 1 drink per day for nonpregnant women. One drink equals 12 ounces of beer, 5 ounces of wine, or 1 ounces of hard liquor.  Do not use street drugs.  Do not share needles.  Ask your health care provider for help if  you need support or information about quitting drugs.  Tell your health care provider if you often feel depressed.  Tell your health care provider if you have ever been abused or do not feel safe at home.   This information is not intended to replace advice given to you by your health care provider. Make sure you discuss any questions you have with your health care provider.   Document Released: 08/16/2010 Document Revised: 02/21/2014 Document Reviewed: 01/02/2013 Elsevier Interactive Patient Education [employer].

## 2015-02-12 NOTE — Addendum Note (Signed)
Addended by: Dennie BibleAVIS, Makana Feigel R on: 02/12/2015 10:42 AM   Modules accepted: Kipp Brood[employer]Set

## 2015-02-12 NOTE — Progress Notes (Signed)
Pre visit review using our clinic review tool, if applicable. No additional management support is needed unless otherwise documented below in the visit note. 

## 2015-02-13 LAB — HEPATITIS C ANTIBODY: HCV Ab: NEGATIVE

## 2015-02-24 ENCOUNTER — Ambulatory Visit: Payer: BC Managed Care – PPO

## 2015-03-03 ENCOUNTER — Ambulatory Visit
Admission: RE | Admit: 2015-03-03 | Discharge: 2015-03-03 | Disposition: A | Discharge status: Home / Self Care | Payer: BC Managed Care – PPO | Source: Ambulatory Visit | Attending: Internal Medicine | Admitting: Internal Medicine

## 2015-03-03 DIAGNOSIS — N63 Unspecified lump in breast: Secondary | ICD-10-CM | POA: Diagnosis not present

## 2015-03-03 DIAGNOSIS — Z1231 Encounter for screening mammogram for malignant neoplasm of breast: Secondary | ICD-10-CM | POA: Insufficient documentation

## 2015-03-03 DIAGNOSIS — Z1239 Encounter for other screening for malignant neoplasm of breast: Secondary | ICD-10-CM

## 2016-02-29 ENCOUNTER — Ambulatory Visit (INDEPENDENT_AMBULATORY_CARE_PROVIDER_SITE_OTHER): Payer: BC Managed Care – PPO | Admitting: Primary Care

## 2016-02-29 ENCOUNTER — Encounter: Payer: Self-pay | Admitting: Primary Care

## 2016-02-29 ENCOUNTER — Other Ambulatory Visit (HOSPITAL_COMMUNITY)
Admission: RE | Admit: 2016-02-29 | Discharge: 2016-02-29 | Disposition: A | Discharge status: Home / Self Care | Payer: BC Managed Care – PPO | Source: Ambulatory Visit | Attending: Primary Care | Admitting: Primary Care

## 2016-02-29 VITALS — BP 106/68 | HR 72 | Temp 98.0°F | Ht 66.0 in | Wt 159.4 lb

## 2016-02-29 DIAGNOSIS — Z1151 Encounter for screening for human papillomavirus (HPV): Secondary | ICD-10-CM | POA: Diagnosis not present

## 2016-02-29 DIAGNOSIS — N8110 Cystocele, unspecified: Secondary | ICD-10-CM

## 2016-02-29 DIAGNOSIS — E559 Vitamin D deficiency, unspecified: Secondary | ICD-10-CM

## 2016-02-29 DIAGNOSIS — Z1239 Encounter for other screening for malignant neoplasm of breast: Secondary | ICD-10-CM

## 2016-02-29 DIAGNOSIS — Z1231 Encounter for screening mammogram for malignant neoplasm of breast: Secondary | ICD-10-CM

## 2016-02-29 DIAGNOSIS — Z124 Encounter for screening for malignant neoplasm of cervix: Secondary | ICD-10-CM | POA: Diagnosis not present

## 2016-02-29 DIAGNOSIS — Z01411 Encounter for gynecological examination (general) (routine) with abnormal findings: Secondary | ICD-10-CM | POA: Insufficient documentation

## 2016-02-29 DIAGNOSIS — Z0000 Encounter for general adult medical examination without abnormal findings: Secondary | ICD-10-CM | POA: Diagnosis not present

## 2016-02-29 LAB — LIPID PANEL
CHOL/HDL RATIO: 3
Cholesterol: 202 mg/dL — ABNORMAL HIGH (ref 0–200)
HDL: 72.3 mg/dL (ref >39.00)
LDL CALC: 112 mg/dL — AB (ref 0–99)
NONHDL: 129.88
Triglycerides: 90 mg/dL (ref 0.0–149.0)
VLDL: 18 mg/dL (ref 0.0–40.0)

## 2016-02-29 LAB — COMPREHENSIVE METABOLIC PANEL
ALK PHOS: 66 U/L (ref 39–117)
ALT: 15 U/L (ref 0–35)
AST: 18 U/L (ref 0–37)
Albumin: 4.3 g/dL (ref 3.5–5.2)
BUN: 12 mg/dL (ref 6–23)
CHLORIDE: 105 meq/L (ref 96–112)
CO2: 30 meq/L (ref 19–32)
Calcium: 9.4 mg/dL (ref 8.4–10.5)
Creatinine, Ser: 0.73 mg/dL (ref 0.40–1.20)
GFR: 87.58 mL/min (ref >60.00)
GLUCOSE: 89 mg/dL (ref 70–99)
POTASSIUM: 3.9 meq/L (ref 3.5–5.1)
SODIUM: 140 meq/L (ref 135–145)
Total Bilirubin: 0.5 mg/dL (ref 0.2–1.2)
Total Protein: 7.4 g/dL (ref 6.0–8.3)

## 2016-02-29 LAB — VITAMIN D 25 HYDROXY (VIT D DEFICIENCY, FRACTURES): VITD: 27.83 ng/mL — ABNORMAL LOW (ref 30.00–100.00)

## 2016-02-29 NOTE — Progress Notes (Signed)
Pre visit review using our clinic review tool, if applicable. No additional management support is needed unless otherwise documented below in the visit note. 

## 2016-02-29 NOTE — Assessment & Plan Note (Signed)
Noted during exam, overall not bothersome and is not interested in pessary or surgical intervention. Discussed kegel exercises and to avoid heavy lifting.

## 2016-02-29 NOTE — Patient Instructions (Signed)
Schedule your mammogram at your convenience through the [facility].  Complete lab work prior to leaving today. I will notify you of your results once received.   Continue to work on a healthy diet and regular exercise.  Continue exercising. You should be getting 150 minutes of moderate intensity exercise weekly.  Ensure you are consuming 64 ounces of water daily.  Follow up in 1 year for your physical or sooner if needed.  It was a pleasure to meet you today! Please don't hesitate to call me with any questions. [facility]!

## 2016-02-29 NOTE — Progress Notes (Signed)
Subjective:    Patient ID: Claudia Dawson, female    DOB: [birth year], 56 y.o.   MRN: [id number]  HPI  Claudia Dawson is a 56 year old female who presents today to [facility] and for complete physical.  Immunizations: -Tetanus: Completed in 2015 -Influenza: Completed in October 2017   Diet: She endorses a fair diet Breakfast: Oatmeal, boiled egg, cereal, occasionally sandwich Lunch: Sandwich, salad, yogurt Dinner: Meat, vegetables, occasionally fast food. Snacks: Occasionally, nuts, fruit Desserts: Occasional chocolate  Beverages: Water, occasional juice, coffee  Exercise: She exercises twice weekly. Eye exam: Completed in 2017 Dental exam: Completes semi-annually. Colonoscopy: Completed in 2012, unremarkable.  Pap Smear: Completed in 2014, negative. Due. Mammogram: Completed in 02/2015, due.    Review of Systems  Constitutional: Negative for unexpected weight change.  HENT: Negative for rhinorrhea.   Respiratory: Negative for cough and shortness of breath.   Cardiovascular: Negative for chest pain.  Gastrointestinal: Negative for constipation and diarrhea.  Genitourinary: Negative for difficulty urinating and menstrual problem.  Musculoskeletal: Negative for arthralgias and myalgias.  Skin: Negative for rash.  Allergic/Immunologic: Negative for environmental allergies.  Neurological: Negative for dizziness, numbness and headaches.  Psychiatric/Behavioral:       She denies concerns for anxiety and depression       Past Medical History:  Diagnosis Date  . Allergy      Social History   Social History  . Marital status: Married    Spouse name: N/A  . Number of children: N/A  . Years of education: N/A   Occupational History  . Not on file.   Social History Main Topics  . Smoking status: Never Smoker  . Smokeless tobacco: Never Used  . Alcohol use No  . Drug use: No  . Sexual activity: Not on file   Other Topics  Concern  . Not on file   Social History Narrative   [place] in [place]    Husband died of MI at age 57 in Jan 2014.    Daughters live nearby.   Work - Geologist, engineeringteacher assistant for first grade.   Exercise - walks and lifts weights with group from church.    Past Surgical History:  Procedure Laterality Date  . BREAST CYST ASPIRATION Bilateral   . CESAREAN SECTION    . TUBAL LIGATION      Family History  Problem Relation Age of Onset  . Hypertension Father   . Atrial fibrillation Father   . Heart disease Father     afib  . Ulcers Father   . Cancer Maternal Grandmother     Endometrial  . Stroke Paternal Grandmother   . Cancer Brother     kidney, unsure if malignant tumor  . Cancer Maternal Aunt     half-aunt breast cancer    No Known Allergies  Current Outpatient Prescriptions on File Prior to Visit  Medication Sig Dispense Refill  . b complex vitamins tablet Take 1 tablet by mouth daily.    . cholecalciferol (VITAMIN D) 1000 units tablet Take 2,000 Units by mouth daily.    . Probiotic Product (PROBIOTIC DAILY PO) Take by mouth.     No current facility-administered medications on file prior to visit.     BP 106/68   Pulse 72   Temp 98 F (36.7 C) (Oral)   Ht 5\' 6"  (1.676 m)   Wt 159 lb 6.4 oz (72.3 kg)   SpO2 99%   BMI 25.73 kg/m  Objective:   Physical Exam  Constitutional: She is oriented to person, place, and time. She appears well-nourished.  HENT:  Right Ear: Tympanic membrane and ear canal normal.  Left Ear: Tympanic membrane and ear canal normal.  Nose: Nose normal.  Mouth/Throat: Oropharynx is clear and moist.  Eyes: Conjunctivae and EOM are normal. Pupils are equal, round, and reactive to light.  Neck: Neck supple. No thyromegaly present.  Cardiovascular: Normal rate and regular rhythm.   No murmur heard. Pulmonary/Chest: Effort normal and breath sounds normal. She has no rales.  Abdominal: Soft. Bowel sounds are normal. There is no  tenderness.  Genitourinary: There is no tenderness or lesion on the right labia. There is no tenderness or lesion on the left labia. Cervix exhibits no motion tenderness and no discharge. Right adnexum displays no tenderness. Left adnexum displays no tenderness. No vaginal discharge found.  Musculoskeletal: Normal range of motion.  Lymphadenopathy:    She has no cervical adenopathy.  Neurological: She is alert and oriented to person, place, and time. She has normal reflexes. No cranial nerve deficit.  Skin: Skin is warm and dry. No rash noted.  Psychiatric: She has a normal mood and affect.          Assessment & Plan:

## 2016-02-29 NOTE — Assessment & Plan Note (Signed)
Immunizations UTD. Pap due, completed today. Mammogram due, ordered. Colonoscopy UTD. Exam unremarkable. Labs pending. Discussed the importance of a healthy diet and regular exercise in order for weight loss, and to reduce the risk of other medical diseases. Follow up in 1 year for annual exam.

## 2016-03-03 LAB — CYTOLOGY - PAP
DIAGNOSIS: NEGATIVE
HPV: NOT DETECTED

## 2016-05-30 ENCOUNTER — Ambulatory Visit
Admission: RE | Admit: 2016-05-30 | Discharge: 2016-05-30 | Disposition: A | Discharge status: Home / Self Care | Payer: BC Managed Care – PPO | Source: Ambulatory Visit | Attending: Primary Care | Admitting: Primary Care

## 2016-05-30 DIAGNOSIS — Z1231 Encounter for screening mammogram for malignant neoplasm of breast: Secondary | ICD-10-CM | POA: Diagnosis present

## 2016-05-30 DIAGNOSIS — Z1239 Encounter for other screening for malignant neoplasm of breast: Secondary | ICD-10-CM

## 2017-03-20 ENCOUNTER — Telehealth: Payer: Self-pay | Admitting: Primary Care

## 2017-03-20 NOTE — Telephone Encounter (Signed)
Copied from CRM [phone]. Topic: Quick Communication - See Telephone Encounter >> Mar 20, 2017  5:07 PM Rudi CocoLathan, Jnaya Butrick M, NT wrote: CRM for notification. See Telephone encounter for:   03/20/17. Pt. Calling to see if she can get a referral to have a mammogram done pt. Can be reached at [phone]

## 2017-03-21 ENCOUNTER — Other Ambulatory Visit: Payer: Self-pay | Admitting: Primary Care

## 2017-03-21 DIAGNOSIS — Z1231 Encounter for screening mammogram for malignant neoplasm of breast: Secondary | ICD-10-CM

## 2017-03-21 NOTE — Telephone Encounter (Signed)
Left message asking pt to call office  °

## 2017-06-07 ENCOUNTER — Encounter: Payer: Self-pay | Admitting: Primary Care

## 2017-06-07 ENCOUNTER — Ambulatory Visit
Admission: RE | Admit: 2017-06-07 | Discharge: 2017-06-07 | Disposition: A | Discharge status: Home / Self Care | Payer: BC Managed Care – PPO | Source: Ambulatory Visit | Attending: Primary Care | Admitting: Primary Care

## 2017-06-07 ENCOUNTER — Other Ambulatory Visit: Payer: Self-pay | Admitting: Primary Care

## 2017-06-07 ENCOUNTER — Ambulatory Visit (INDEPENDENT_AMBULATORY_CARE_PROVIDER_SITE_OTHER): Payer: BC Managed Care – PPO | Admitting: Primary Care

## 2017-06-07 VITALS — BP 110/64 | HR 61 | Temp 98.2°F | Ht 66.0 in | Wt 160.2 lb

## 2017-06-07 DIAGNOSIS — Z1231 Encounter for screening mammogram for malignant neoplasm of breast: Secondary | ICD-10-CM

## 2017-06-07 DIAGNOSIS — N8110 Cystocele, unspecified: Secondary | ICD-10-CM

## 2017-06-07 DIAGNOSIS — J302 Other seasonal allergic rhinitis: Secondary | ICD-10-CM

## 2017-06-07 DIAGNOSIS — R7303 Prediabetes: Secondary | ICD-10-CM

## 2017-06-07 DIAGNOSIS — Z23 Encounter for immunization: Secondary | ICD-10-CM | POA: Diagnosis not present

## 2017-06-07 DIAGNOSIS — E875 Hyperkalemia: Secondary | ICD-10-CM

## 2017-06-07 DIAGNOSIS — E559 Vitamin D deficiency, unspecified: Secondary | ICD-10-CM | POA: Diagnosis not present

## 2017-06-07 DIAGNOSIS — Z0000 Encounter for general adult medical examination without abnormal findings: Secondary | ICD-10-CM | POA: Diagnosis not present

## 2017-06-07 LAB — COMPREHENSIVE METABOLIC PANEL
ALBUMIN: 4.3 g/dL (ref 3.5–5.2)
ALT: 14 U/L (ref 0–35)
AST: 19 U/L (ref 0–37)
Alkaline Phosphatase: 73 U/L (ref 39–117)
BILIRUBIN TOTAL: 0.5 mg/dL (ref 0.2–1.2)
BUN: 17 mg/dL (ref 6–23)
CALCIUM: 9.6 mg/dL (ref 8.4–10.5)
CO2: 28 meq/L (ref 19–32)
CREATININE: 0.81 mg/dL (ref 0.40–1.20)
Chloride: 104 mEq/L (ref 96–112)
GFR: 77.33 mL/min (ref >60.00)
Glucose, Bld: 88 mg/dL (ref 70–99)
Potassium: 5.3 mEq/L — ABNORMAL HIGH (ref 3.5–5.1)
Sodium: 139 mEq/L (ref 135–145)
Total Protein: 7.4 g/dL (ref 6.0–8.3)

## 2017-06-07 LAB — LIPID PANEL
CHOLESTEROL: 218 mg/dL — AB (ref 0–200)
HDL: 75 mg/dL (ref >39.00)
LDL Cholesterol: 131 mg/dL — ABNORMAL HIGH (ref 0–99)
NonHDL: 143.07
TRIGLYCERIDES: 61 mg/dL (ref 0.0–149.0)
Total CHOL/HDL Ratio: 3
VLDL: 12.2 mg/dL (ref 0.0–40.0)

## 2017-06-07 LAB — VITAMIN D 25 HYDROXY (VIT D DEFICIENCY, FRACTURES): VITD: 44.46 ng/mL (ref 30.00–100.00)

## 2017-06-07 LAB — HEMOGLOBIN A1C: Hgb A1c MFr Bld: 5.8 % (ref 4.6–6.5)

## 2017-06-07 MED ORDER — ZOSTER VAC RECOMB ADJUVANTED 50 MCG/0.5ML IM SUSR: 0.5000 mL | Freq: Once | INTRAMUSCULAR | 1 refills | Status: AC

## 2017-06-07 NOTE — Assessment & Plan Note (Signed)
Overall not bothersome. Discussed options for treatment if this becomes problematic. Consider GYN referral.

## 2017-06-07 NOTE — Progress Notes (Signed)
Subjective:    Patient ID: Claudia Dawson, female    DOB: 26-Jan-1961, 57 y.o.   MRN: [id number]  HPI  Ms. Claudia Dawson is a 57 year old female who presents today for complete physical.  Immunizations: -Tetanus: Completed in 2015 -Influenza: Due -Shingles: Never completed  Diet: She endorses a fair diet Breakfast: Boiled eggs, cereal, oatmeal, occasional smoothie Lunch: Sandwich, fruit, chips, salad Dinner: Meat, vegetables, occasionally pizza Snacks: Tortilla chips, salsa, fruit, popcorn Desserts: Occasionally ice cream Beverages: Water, occasional soda, coffee, occasionally alcohol   Exercise: Walks with weights twice weekly for 45 minutes, active in the yard Eye exam: Completed in Summer 2018 Dental exam: Completes semi-annually  Colonoscopy: Completed in 2012 Pap Smear: Completed in 2018 Mammogram: Scheduled for today Hep C Screen: Negative in 2016   Review of Systems  Constitutional: Negative for unexpected weight change.  HENT: Negative for rhinorrhea.   Respiratory: Negative for cough and shortness of breath.   Cardiovascular: Negative for chest pain.  Gastrointestinal: Negative for constipation and diarrhea.  Genitourinary: Negative for difficulty urinating and menstrual problem.  Musculoskeletal: Negative for arthralgias and myalgias.  Skin: Negative for rash.  Allergic/Immunologic: Negative for environmental allergies.  Neurological: Negative for dizziness, numbness and headaches.  Psychiatric/Behavioral: The patient is not nervous/anxious.        Past Medical History:  Diagnosis Date  . Allergy   . Prediabetes      Social History   Socioeconomic History  . Marital status: Widowed    Spouse name: Not on file  . Number of children: Not on file  . Years of education: Not on file  . Highest education level: Not on file  Occupational History  . Not on file  Social Needs  . Financial resource strain: Not on file  . Food insecurity:   Worry: Not on file    Inability: Not on file  . Transportation needs:    Medical: Not on file    Non-medical: Not on file  Tobacco Use  . Smoking status: Never Smoker  . Smokeless tobacco: Never Used  Substance and Sexual Activity  . Alcohol use: No  . Drug use: No  . Sexual activity: Not on file  Lifestyle  . Physical activity:    Days per week: Not on file    Minutes per session: Not on file  . Stress: Not on file  Relationships  . Social connections:    Talks on phone: Not on file    Gets together: Not on file    Attends religious service: Not on file    Active member of club or organization: Not on file    Attends meetings of clubs or organizations: Not on file    Relationship status: Not on file  . Intimate partner violence:    Fear of current or ex partner: Not on file    Emotionally abused: Not on file    Physically abused: Not on file    Forced sexual activity: Not on file  Other Topics Concern  . Not on file  Social History Narrative   Lives in [place].    Husband died of MI at age 53 in 03-01-2012.    Daughters live nearby.   Work - Geologist, engineering for first grade.   Exercise - walks and lifts weights with group from church.    Past Surgical History:  Procedure Laterality Date  . BREAST CYST ASPIRATION Bilateral   . CESAREAN SECTION    . TUBAL LIGATION  Family History  Problem Relation Age of Onset  . Hypertension Father   . Atrial fibrillation Father   . Heart disease Father        afib  . Ulcers Father   . Cancer Maternal Grandmother        Endometrial  . Stroke Paternal Grandmother   . Cancer Brother        kidney, unsure if malignant tumor  . Cancer Maternal Aunt        half-aunt breast cancer    No Known Allergies  Current Outpatient Medications on File Prior to Visit  Medication Sig Dispense Refill  . b complex vitamins tablet Take 1 tablet by mouth daily.    . Cholecalciferol (VITAMIN D3) 5000 units CAPS Take 1 capsule by  mouth daily.    . fluticasone (FLONASE) 50 MCG/ACT nasal spray Place 1 spray into both nostrils 2 (two) times daily.    . Probiotic Product (PROBIOTIC DAILY PO) Take by mouth.     No current facility-administered medications on file prior to visit.     BP 110/64 (BP Location: Right Arm, Patient Position: Sitting, Cuff Size: Normal)   Pulse 61   Temp 98.2 F (36.8 C) (Oral)   Ht 5\' 6"  (1.676 m)   Wt 160 lb 4 oz (72.7 kg)   SpO2 98%   BMI 25.87 kg/m    Objective:   Physical Exam  Constitutional: She is oriented to person, place, and time. She appears well-nourished.  HENT:  Right Ear: Tympanic membrane and ear canal normal.  Left Ear: Tympanic membrane and ear canal normal.  Nose: Nose normal.  Mouth/Throat: Oropharynx is clear and moist.  Eyes: Pupils are equal, round, and reactive to light. Conjunctivae and EOM are normal.  Neck: Neck supple. No thyromegaly present.  Cardiovascular: Normal rate and regular rhythm.  No murmur heard. Pulmonary/Chest: Effort normal and breath sounds normal. She has no rales.  Abdominal: Soft. Bowel sounds are normal. There is no tenderness.  Musculoskeletal: Normal range of motion.  Lymphadenopathy:    She has no cervical adenopathy.  Neurological: She is alert and oriented to person, place, and time. She has normal reflexes. No cranial nerve deficit.  Skin: Skin is warm and dry. No rash noted.  Psychiatric: She has a normal mood and affect.          Assessment & Plan:

## 2017-06-07 NOTE — Assessment & Plan Note (Signed)
Tetanus and influenza UTD. Rx for Shingrix printed today. Pap smear UTD. Mammogram due today. Colonoscopy due in 2022. Overall healthy diet, recommended to increase exercise. Exam unremarkable.  Labs pending. Follow up in 1 year for CPE.

## 2017-06-07 NOTE — Patient Instructions (Addendum)
Stop by the lab prior to leaving today. I will notify you of your results once received.   Continue exercising. You should be getting 150 minutes of moderate intensity exercise weekly.  Ensure you are consuming 64 ounces of water daily.  Increase consumption of vegetables, fruit, whole grains, lean protein.  Take the Shingles vaccination to your pharmacy for administration.   Complete your mammogram as scheduled.   Follow up in 1 year for your annual exam or sooner if needed.  It was a pleasure to see you today!   Preventive Care 40-64 Years, Female Preventive care refers to lifestyle choices and visits with your health care provider that can promote health and wellness. What does preventive care include?  A yearly physical exam. This is also called an annual well check.  Dental exams once or twice a year.  Routine eye exams. Ask your health care provider how often you should have your eyes checked.  Personal lifestyle choices, including: ? Daily care of your teeth and gums. ? Regular physical activity. ? Eating a healthy diet. ? Avoiding tobacco and drug use. ? Limiting alcohol use. ? Practicing safe sex. ? Taking low-dose aspirin daily starting at age 44. ? Taking vitamin and mineral supplements as recommended by your health care provider. What happens during an annual well check? The services and screenings done by your health care provider during your annual well check will depend on your age, overall health, lifestyle risk factors, and family history of disease. Counseling Your health care provider may ask you questions about your:  Alcohol use.  Tobacco use.  Drug use.  Emotional well-being.  Home and relationship well-being.  Sexual activity.  Eating habits.  Work and work Statistician.  Method of birth control.  Menstrual cycle.  Pregnancy history.  Screening You may have the following tests or measurements:  Height, weight, and BMI.  Blood  pressure.  Lipid and cholesterol levels. These may be checked every 5 years, or more frequently if you are over 74 years old.  Skin check.  Lung cancer screening. You may have this screening every year starting at age 37 if you have a 30-pack-year history of smoking and currently smoke or have quit within the past 15 years.  Fecal occult blood test (FOBT) of the stool. You may have this test every year starting at age 67.  Flexible sigmoidoscopy or colonoscopy. You may have a sigmoidoscopy every 5 years or a colonoscopy every 10 years starting at age 30.  Hepatitis C blood test.  Hepatitis B blood test.  Sexually transmitted disease (STD) testing.  Diabetes screening. This is done by checking your blood sugar (glucose) after you have not eaten for a while (fasting). You may have this done every 1-3 years.  Mammogram. This may be done every 1-2 years. Talk to your health care provider about when you should start having regular mammograms. This may depend on whether you have a family history of breast cancer.  BRCA-related cancer screening. This may be done if you have a family history of breast, ovarian, tubal, or peritoneal cancers.  Pelvic exam and Pap test. This may be done every 3 years starting at age 53. Starting at age 47, this may be done every 5 years if you have a Pap test in combination with an HPV test.  Bone density scan. This is done to screen for osteoporosis. You may have this scan if you are at high risk for osteoporosis.  Discuss your test results, treatment  options, and if necessary, the need for more tests with your health care provider. Vaccines Your health care provider may recommend certain vaccines, such as:  Influenza vaccine. This is recommended every year.  Tetanus, diphtheria, and acellular pertussis (Tdap, Td) vaccine. You may need a Td booster every 10 years.  Varicella vaccine. You may need this if you have not been vaccinated.  Zoster vaccine. You  may need this after age 45.  Measles, mumps, and rubella (MMR) vaccine. You may need at least one dose of MMR if you were born in 1957 or later. You may also need a second dose.  Pneumococcal 13-valent conjugate (PCV13) vaccine. You may need this if you have certain conditions and were not previously vaccinated.  Pneumococcal polysaccharide (PPSV23) vaccine. You may need one or two doses if you smoke cigarettes or if you have certain conditions.  Meningococcal vaccine. You may need this if you have certain conditions.  Hepatitis A vaccine. You may need this if you have certain conditions or if you travel or work in places where you may be exposed to hepatitis A.  Hepatitis B vaccine. You may need this if you have certain conditions or if you travel or work in places where you may be exposed to hepatitis B.  Haemophilus influenzae type b (Hib) vaccine. You may need this if you have certain conditions.  Talk to your health care provider about which screenings and vaccines you need and how often you need them. This information is not intended to replace advice given to you by your health care provider. Make sure you discuss any questions you have with your health care provider. Document Released: 02/27/2015 Document Revised: 10/21/2015 Document Reviewed: 12/02/2014 Elsevier Interactive Patient Education  [employer].

## 2017-06-07 NOTE — Progress Notes (Signed)
Subjective:    Patient ID: Claudia Dawson, female    DOB: 04/24/1960, 57 y.o.   MRN: [id number]  HPI Claudia Dawson is a 57 y.o. female who presents today for her annual physical. HPI completed by Mayra Reel, AGNP.   Review of Systems  Constitutional: Negative for activity change, appetite change and fatigue.  HENT: Negative for dental problem, ear pain, hearing loss, sinus pain, sore throat, trouble swallowing and voice change.   Eyes: Negative for pain, itching and visual disturbance.  Respiratory: Negative for cough, chest tightness and shortness of breath.   Cardiovascular: Negative for chest pain and palpitations.  Gastrointestinal: Negative for abdominal pain, anal bleeding, blood in stool, constipation, diarrhea, nausea and vomiting.  Endocrine: Negative for cold intolerance, heat intolerance, polydipsia, polyphagia and polyuria.  Genitourinary: Negative for difficulty urinating, dysuria, frequency, pelvic pain, urgency, vaginal bleeding, vaginal discharge and vaginal pain.  Musculoskeletal: Negative for back pain, joint swelling, neck pain and neck stiffness.  Skin: Negative for color change.  Allergic/Immunologic: Positive for environmental allergies. Negative for food allergies.  Neurological: Negative for dizziness, weakness, light-headedness and numbness.  Psychiatric/Behavioral: Negative for agitation, behavioral problems, decreased concentration, hallucinations and sleep disturbance. The patient is not nervous/anxious.       Past Medical History:  Diagnosis Date  . Allergy    post-streptococcal glomerulonephritis Social History   Socioeconomic History  . Marital status: Widowed    Spouse name: Not on file  . Number of children: Not on file  . Years of education: Not on file  . Highest education level: Not on file  Occupational History  . Not on file  Social Needs  . Financial resource strain: Not on file  . Food insecurity:    Worry: Not on  file    Inability: Not on file  . Transportation needs:    Medical: Not on file    Non-medical: Not on file  Tobacco Use  . Smoking status: Never Smoker  . Smokeless tobacco: Never Used  Substance and Sexual Activity  . Alcohol use: No  . Drug use: No  . Sexual activity: Not on file  Lifestyle  . Physical activity:    Days per week: Not on file    Minutes per session: Not on file  . Stress: Not on file  Relationships  . Social connections:    Talks on phone: Not on file    Gets together: Not on file    Attends religious service: Not on file    Active member of club or organization: Not on file    Attends meetings of clubs or organizations: Not on file    Relationship status: Not on file  . Intimate partner violence:    Fear of current or ex partner: Not on file    Emotionally abused: Not on file    Physically abused: Not on file    Forced sexual activity: Not on file  Other Topics Concern  . Not on file  Social History Narrative   Lives in [place].    Husband died of MI at age 33 in 03/13/2012.    Daughters live nearby.   Work - Geologist, engineering for first grade.   Exercise - walks and lifts weights with group from church.   Family History  Problem Relation Age of Onset  . Hypertension Father   . Atrial fibrillation Father   . Heart disease Father        afib  . Ulcers Father   .  Cancer Maternal Grandmother        Endometrial  . Stroke Paternal Grandmother   . Cancer Brother        kidney, unsure if malignant tumor  . Cancer Maternal Aunt        half-aunt breast cancer   Current Outpatient Medications on File Prior to Visit  Medication Sig Dispense Refill  . b complex vitamins tablet Take 1 tablet by mouth daily.    . Cholecalciferol (VITAMIN D3) 5000 units CAPS Take 1 capsule by mouth daily.    . Probiotic Product (PROBIOTIC DAILY PO) Take by mouth.     No current facility-administered medications on file prior to visit.     Objective:   Physical  Exam  Constitutional: She is oriented to person, place, and time. She appears well-nourished. No distress.  HENT:  Head: Normocephalic.  Right Ear: External ear normal.  Left Ear: External ear normal.  Nose: Nose normal.  Mouth/Throat: Oropharynx is clear and moist. No oropharyngeal exudate.  Eyes: Conjunctivae and EOM are normal.  Neck: Normal range of motion. Neck supple. No tracheal deviation present. No thyromegaly present.  Cardiovascular: Normal rate and regular rhythm. Exam reveals no gallop and no friction rub.  No murmur heard. Pulmonary/Chest: Effort normal and breath sounds normal. No respiratory distress.  Abdominal: Soft. Bowel sounds are normal. She exhibits no distension and no mass. There is no tenderness.  Musculoskeletal: Normal range of motion.  Lymphadenopathy:    She has no cervical adenopathy.  Neurological: She is alert and oriented to person, place, and time. She has normal reflexes. No cranial nerve deficit.  Skin: Skin is warm and dry. No erythema.  Psychiatric: She has a normal mood and affect. Her behavior is normal.    BP 110/64 (BP Location: Right Arm, Patient Position: Sitting, Cuff Size: Normal)   Pulse 61   Temp 98.2 F (36.8 C) (Oral)   Ht 5\' 6"  (1.676 m)   Wt 160 lb 4 oz (72.7 kg)   SpO2 98%   BMI 25.87 kg/m    BP Readings from Last 3 Encounters:  06/07/17 110/64  02/29/16 106/68  02/12/15 109/66   Wt Readings from Last 3 Encounters:  06/07/17 160 lb 4 oz (72.7 kg)  02/29/16 159 lb 6.4 oz (72.3 kg)  02/12/15 156 lb (70.8 kg)      Assessment & Plan:   1. Need for shingles vaccine - Zoster Vaccine Adjuvanted [facility](SHINGRIX) injection; Inject 0.5 mLs into the muscle once for 1 dose. Repeat with second dose in 2-6 months.  Dispense: 0.5 mL; Refill: 1  2. Routine general medical examination at a health care facility - Lipid panel - Comprehensive metabolic panel  3. Prediabetes - Hemoglobin A1c  4. Vitamin D deficiency - VITAMIN D 25  Hydroxy (Vit-D Deficiency, Fractures)  5. Vaginal prolapse - Monitor and referral to Gyn/Onc if it becomes problematic  6. Seasonal allergies - Continue flonase  Rebecka ApleyJoshua M Lisel Siegrist, RN, Adult-Geriatric Nurse Practitioner Student

## 2017-06-07 NOTE — Assessment & Plan Note (Signed)
Using Flonase with improvement. Continue same.

## 2017-06-12 ENCOUNTER — Other Ambulatory Visit (INDEPENDENT_AMBULATORY_CARE_PROVIDER_SITE_OTHER): Payer: BC Managed Care – PPO

## 2017-06-12 DIAGNOSIS — E875 Hyperkalemia: Secondary | ICD-10-CM | POA: Diagnosis not present

## 2017-06-12 LAB — POTASSIUM: POTASSIUM: 4.5 meq/L (ref 3.5–5.1)

## 2017-12-20 ENCOUNTER — Ambulatory Visit: Payer: BC Managed Care – PPO | Admitting: Internal Medicine

## 2017-12-20 ENCOUNTER — Encounter: Payer: Self-pay | Admitting: Internal Medicine

## 2017-12-20 VITALS — BP 116/70 | HR 71 | Temp 98.1°F | Wt 160.0 lb

## 2017-12-20 DIAGNOSIS — J302 Other seasonal allergic rhinitis: Secondary | ICD-10-CM

## 2017-12-20 NOTE — Patient Instructions (Signed)

## 2017-12-20 NOTE — Progress Notes (Signed)
HPI  Pt presents to the clinic today with c/o runny nose and cough. She reports this started 3 weeks ago. She is blowing clear mucous out of her nose. The cough is productive of clear mucous. She denies ear pain, nasal congestion, sore throat or shortness of breath. She denies fever, chills or body aches. She has tried Claritin D and Mucinex with some relief. She has a history of allergies. She has not had sick contacts.  Review of Systems      Past Medical History:  Diagnosis Date  . Allergy   . Prediabetes     Family History  Problem Relation Age of Onset  . Hypertension Father   . Atrial fibrillation Father   . Heart disease Father        afib  . Ulcers Father   . Cancer Maternal Grandmother        Endometrial  . Stroke Paternal Grandmother   . Cancer Brother        kidney, unsure if malignant tumor  . Cancer Maternal Aunt        half-aunt breast cancer  . Breast cancer Neg Hx     Social History   Socioeconomic History  . Marital status: Widowed    Spouse name: Not on file  . Number of children: Not on file  . Years of education: Not on file  . Highest education level: Not on file  Occupational History  . Not on file  Social Needs  . Financial resource strain: Not on file  . Food insecurity:    Worry: Not on file    Inability: Not on file  . Transportation needs:    Medical: Not on file    Non-medical: Not on file  Tobacco Use  . Smoking status: Never Smoker  . Smokeless tobacco: Never Used  Substance and Sexual Activity  . Alcohol use: No  . Drug use: No  . Sexual activity: Not on file  Lifestyle  . Physical activity:    Days per week: Not on file    Minutes per session: Not on file  . Stress: Not on file  Relationships  . Social connections:    Talks on phone: Not on file    Gets together: Not on file    Attends religious service: Not on file    Active member of club or organization: Not on file    Attends meetings of clubs or organizations: Not  on file    Relationship status: Not on file  . Intimate partner violence:    Fear of current or ex partner: Not on file    Emotionally abused: Not on file    Physically abused: Not on file    Forced sexual activity: Not on file  Other Topics Concern  . Not on file  Social History Narrative   Lives in [place].    Husband died of MI at age 30 in 02-28-12.    Daughters live nearby.   Work - Geologist, engineering for first grade.   Exercise - walks and lifts weights with group from church.    No Known Allergies   Constitutional: Denies headache, fatigue, fever or abrupt weight changes.  HEENT:  Positive runny nose, . Denies eye redness, eye pain, pressure behind the eyes, facial pain, nasal congestion, ear pain, ringing in the ears, wax buildup, or sore throat. Respiratory: Positive cough. Denies difficulty breathing or shortness of breath.  Cardiovascular: Denies chest pain, chest tightness, palpitations or swelling  in the hands or feet.   No other specific complaints in a complete review of systems (except as listed in HPI above).  Objective:   BP 116/70   Pulse 71   Temp 98.1 F (36.7 C) (Oral)   Wt 160 lb (72.6 kg)   SpO2 98%   BMI 25.82 kg/m  Wt Readings from Last 3 Encounters:  12/20/17 160 lb (72.6 kg)  06/07/17 160 lb 4 oz (72.7 kg)  02/29/16 159 lb 6.4 oz (72.3 kg)     General: Appears her stated age, in NAD. HEENT: Head: normal shape and size, no sinus tenderness noted; Ears: Tm's gray and intact, not as translucent, normal light reflex; Nose: mucosa boggy and moist, septum midline; Throat/Mouth: Teeth present, mucosa pink and moist, no exudate noted, no lesions or ulcerations noted.  Neck: No cervical lymphadenopathy.  Cardiovascular: Normal rate and rhythm. S1,S2 noted.  No murmur, rubs or gallops noted.  Pulmonary/Chest: Normal effort and positive vesicular breath sounds. No respiratory distress. No wheezes, rales or ronchi noted.       Assessment &  Plan:   Allergic Rhinitis:  Get some rest and drink plenty of water Stop Claritin D and Mucinex Start Allegra and Flonase OTC Offered 80 mg Depo, she declines at this time [employer] for [employer], she declines  RTC as needed or if symptoms persist.   Nicki Reaper, NP

## 2018-08-01 ENCOUNTER — Other Ambulatory Visit: Payer: Self-pay | Admitting: Primary Care

## 2018-08-05 ENCOUNTER — Ambulatory Visit (INDEPENDENT_AMBULATORY_CARE_PROVIDER_SITE_OTHER)
Admission: RE | Admit: 2018-08-05 | Discharge: 2018-08-05 | Disposition: A | Discharge status: Home / Self Care | Payer: BC Managed Care – PPO | Source: Ambulatory Visit

## 2018-08-05 ENCOUNTER — Telehealth: Payer: Self-pay | Admitting: Internal Medicine

## 2018-08-05 DIAGNOSIS — M791 Myalgia, unspecified site: Secondary | ICD-10-CM | POA: Diagnosis not present

## 2018-08-05 DIAGNOSIS — Z20822 Contact with and (suspected) exposure to covid-19: Secondary | ICD-10-CM

## 2018-08-05 DIAGNOSIS — R509 Fever, unspecified: Secondary | ICD-10-CM | POA: Diagnosis not present

## 2018-08-05 NOTE — ED Provider Notes (Signed)
Virtual Visit via Video Note:  Claudia Dawson  initiated request for Telemedicine visit with [facility] team. I connected with Claudia Dawson  on 08/05/2018 at 12:27 PM  for a synchronized telemedicine visit using a video enabled HIPPA compliant telemedicine application. I verified that I am speaking with Claudia Dawson  using two identifiers. [place] Hall-Potvin, PA-C  was physically located in a [place] [facility] site and Ayla Dunigan was located at a different location.   The limitations of evaluation and management by telemedicine as well as the availability of in-person appointments were discussed. Patient was informed that she  may incur a bill ( including co-pay) for this virtual visit encounter. Claudia Dawson  expressed understanding and gave verbal consent to proceed with virtual visit.     History of Present Illness:Claudia Dawson  is a 58 y.o. female with history of prediabetes presents with acute concern of fever.  Patient states that she had a fever last night of 101.8 F.  States this was accompanied with chills and arthralgias.  She reports a history of nasal congestion, though feels this is unchanged.  Patient denies known sick contacts, contact with known COVID positive persons.  Patient states that her temperature has fluctuated, the lowest it has been since onset was 100.7 F without antipyretic or NSAID.  Patient denies fatigue, myalgias, decreased appetite, nausea, vomiting, abdominal pain, chest pain, cough, shortness of breath.  Patient is concern for possibility of coronavirus as with her young grandson and 38 year old mother frequent her home.  Of note, patient was bitten by a tick on 6/6.  Patient states that this was on for less than 12 hours, very small, brown, "it was not a [place] I think it was either nymph or may be a really small deer tick ".  Patient states that the tick was not engorged and  easily removed.  Patient successfully removed ticks head at that time.  She states that she never developed a rash, and was asymptomatic until last night.  Past Medical History:  Diagnosis Date  . Allergy   . Prediabetes     No Known Allergies      Observations/Objective: 58 year old female who is well-developed, well-nourished sitting in no acute distress.  No cough, tachypnea or accessory muscle use noted.  Patient appears to be well-hydrated.  Assessment and Plan: 58 year old female with history of prediabetes presenting for fever less than 24 hours in duration.  Patient has concern for coronavirus and sudden onset of signs/symptoms in conjunction with frequent contact with elderly mother.  Discussed that we cannot rule out possible sequela from tick bite earlier in June, though this is less likely due to duration of attachment, lack of engorgement, size of tick and lack of symptoms until now.  Patient verbalized understanding, intends on following up with PCP to discuss today's visit.  Follow Up Instructions: Patient to call her PCP on Monday to inform of testing status.   I discussed the assessment and treatment plan with the patient. The patient was provided an opportunity to ask questions and all were answered. The patient agreed with the plan and demonstrated an understanding of the instructions.   The patient was advised to call back or seek an in-person evaluation if the symptoms worsen or if the condition fails to improve as anticipated.  I provided 15 minutes of non-face-to-face time during this encounter.    [place] Hall-Potvin, PA-C  08/05/2018 12:27 PM  Hall-Potvin, [place], [place]PA-C 08/05/18 2009

## 2018-08-05 NOTE — Telephone Encounter (Signed)
-----   Message from [place], [place] sent at 08/05/2018 12:30 PM EDT ----- Regarding: NEEDS COVID TESTING Fever, myalgias/arthralgias.

## 2018-08-05 NOTE — Discharge Instructions (Signed)
We ordered COVID testing for you. It is important that you stay home and self isolate until you have the results. If positive, seek medical care if you develop cough, shortness of breath.

## 2018-08-05 NOTE — Telephone Encounter (Signed)
Called pt and appt scheduled for 08/06/18 at [place] at [facility] in [place]

## 2018-08-06 ENCOUNTER — Other Ambulatory Visit: Payer: BC Managed Care – PPO

## 2018-08-06 DIAGNOSIS — Z20822 Contact with and (suspected) exposure to covid-19: Secondary | ICD-10-CM

## 2018-08-10 ENCOUNTER — Telehealth (HOSPITAL_COMMUNITY): Payer: Self-pay | Admitting: Emergency Medicine

## 2018-08-10 LAB — NOVEL CORONAVIRUS, NAA: SARS-CoV-2, NAA: NOT DETECTED

## 2018-08-10 NOTE — Telephone Encounter (Signed)
Your test for COVID-19 was negative.  Please continue good preventive care measures, including:  frequent hand-washing, avoid touching your face, cover coughs/sneezes, stay out of crowds and keep a 6 foot distance from others.  If you develop fever/cough/breathlessness, please stay home for 10 days and until you have had 3 consecutive days with cough/breathlessness improving and without fever (without taking a fever reducer). Go to the nearest hospital ED tent for assessment if fever/cough/breathlessness are severe or illness seems like a threat to life..  Patient contacted and made aware of all results, all questions answered.  

## 2018-08-14 ENCOUNTER — Ambulatory Visit: Payer: BC Managed Care – PPO | Admitting: Primary Care

## 2018-08-14 ENCOUNTER — Encounter: Payer: Self-pay | Admitting: Primary Care

## 2018-08-14 ENCOUNTER — Other Ambulatory Visit: Payer: Self-pay

## 2018-08-14 VITALS — BP 116/70 | HR 73 | Temp 97.8°F | Ht 66.0 in | Wt 157.0 lb

## 2018-08-14 DIAGNOSIS — K121 Other forms of stomatitis: Secondary | ICD-10-CM | POA: Diagnosis not present

## 2018-08-14 DIAGNOSIS — E559 Vitamin D deficiency, unspecified: Secondary | ICD-10-CM

## 2018-08-14 DIAGNOSIS — E785 Hyperlipidemia, unspecified: Secondary | ICD-10-CM | POA: Diagnosis not present

## 2018-08-14 DIAGNOSIS — R7303 Prediabetes: Secondary | ICD-10-CM

## 2018-08-14 HISTORY — DX: Other forms of stomatitis: K12.1

## 2018-08-14 LAB — LIPID PANEL
Cholesterol: 174 mg/dL (ref 0–200)
HDL: 53.3 mg/dL (ref >39.00)
LDL Cholesterol: 93 mg/dL (ref 0–99)
NonHDL: 120.64
Total CHOL/HDL Ratio: 3
Triglycerides: 137 mg/dL (ref 0.0–149.0)
VLDL: 27.4 mg/dL (ref 0.0–40.0)

## 2018-08-14 LAB — COMPREHENSIVE METABOLIC PANEL
ALT: 11 U/L (ref 0–35)
AST: 16 U/L (ref 0–37)
Albumin: 4.3 g/dL (ref 3.5–5.2)
Alkaline Phosphatase: 72 U/L (ref 39–117)
BUN: 11 mg/dL (ref 6–23)
CO2: 28 mEq/L (ref 19–32)
Calcium: 9.3 mg/dL (ref 8.4–10.5)
Chloride: 104 mEq/L (ref 96–112)
Creatinine, Ser: 0.78 mg/dL (ref 0.40–1.20)
GFR: 75.68 mL/min (ref >60.00)
Glucose, Bld: 94 mg/dL (ref 70–99)
Potassium: 4.1 mEq/L (ref 3.5–5.1)
Sodium: 140 mEq/L (ref 135–145)
Total Bilirubin: 0.5 mg/dL (ref 0.2–1.2)
Total Protein: 7 g/dL (ref 6.0–8.3)

## 2018-08-14 LAB — HEMOGLOBIN A1C: Hgb A1c MFr Bld: 6.1 % (ref 4.6–6.5)

## 2018-08-14 LAB — VITAMIN D 25 HYDROXY (VIT D DEFICIENCY, FRACTURES): VITD: 57.63 ng/mL (ref 30.00–100.00)

## 2018-08-14 MED ORDER — TRIAMCINOLONE ACETONIDE 0.1 % MT PSTE: 1.0000 "application " | PASTE | Freq: Two times a day (BID) | OROMUCOSAL | 0 refills | Status: DC

## 2018-08-14 NOTE — Assessment & Plan Note (Signed)
Evident to oral cavity. Rx for triamcinolone 1% paste sent to pharmacy. Discussed directions for use. Follow up PRN.

## 2018-08-14 NOTE — Progress Notes (Signed)
Subjective:    Patient ID: Claudia Dawson, female    DOB: 08-Dec-1960, 58 y.o.   MRN: [id number]  HPI  Ms. Claudia Dawson is a 58 year old female with a history of seasonal allergies who presents today with a chief complaint of mouth lesions. She would also like to complete labs today for her upcoming CPE in late July.   She first noticed her mouth lesions about one week ago. She woke up with a fever one week ago (100.8 axillary), applied cool rags, drank water, then fell back asleep.   She woke up the following day and scheduled an E-Visit. During the E-Visit she was told to take Tylenol and to get tested for Covid-19. That evening she noticed a sore throat and noticed a mouth lesion under her tongue and to her inner lip. She was tested for Covid, tested negative, but thought that the test wasn't very effective.   Since then she's not had a fever but continues with the mouth sores.. She has been bitten by a few ticks during the month of June. She's not noticed rashes or discomfort.   She continues to suffer from the mouth ulcers. She's been gargling warm salt water and peroxide without improvement. She has used a topical OTC gel without improvement.   Review of Systems  Constitutional: Negative for fever.  HENT: Negative for congestion.        Oral ulcers  Respiratory: Negative for cough and shortness of breath.   Cardiovascular: Negative for chest pain.  Psychiatric/Behavioral:       Increased stress recently       Past Medical History:  Diagnosis Date  . Allergy   . Prediabetes      Social History   Socioeconomic History  . Marital status: Widowed    Spouse name: Not on file  . Number of children: Not on file  . Years of education: Not on file  . Highest education level: Not on file  Occupational History  . Not on file  Social Needs  . Financial resource strain: Not on file  . Food insecurity    Worry: Not on file    Inability: Not on file  . Transportation  needs    Medical: Not on file    Non-medical: Not on file  Tobacco Use  . Smoking status: Never Smoker  . Smokeless tobacco: Never Used  Substance and Sexual Activity  . Alcohol use: No  . Drug use: No  . Sexual activity: Not on file  Lifestyle  . Physical activity    Days per week: Not on file    Minutes per session: Not on file  . Stress: Not on file  Relationships  . Social Musicianconnections    Talks on phone: Not on file    Gets together: Not on file    Attends religious service: Not on file    Active member of club or organization: Not on file    Attends meetings of clubs or organizations: Not on file    Relationship status: Not on file  . Intimate partner violence    Fear of current or ex partner: Not on file    Emotionally abused: Not on file    Physically abused: Not on file    Forced sexual activity: Not on file  Other Topics Concern  . Not on file  Social History Narrative   Lives in [place].    Husband died of MI at age 59 in Jan 2014.  Daughters live nearby.   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   Exercise - walks and lifts weights with group from church.    Past Surgical History:  Procedure Laterality Date  . BREAST CYST ASPIRATION Bilateral   . CESAREAN SECTION    . TUBAL LIGATION      Family History  Problem Relation Age of Onset  . Hypertension Father   . Atrial fibrillation Father   . Heart disease Father        afib  . Ulcers Father   . Cancer Maternal Grandmother        Endometrial  . Stroke Paternal Grandmother   . Cancer Brother        kidney, unsure if malignant tumor  . Cancer Maternal Aunt        half-aunt breast cancer  . Breast cancer Neg Hx     No Known Allergies  Current Outpatient Medications on File Prior to Visit  Medication Sig Dispense Refill  . b complex vitamins tablet Take 1 tablet by mouth daily.    . Cholecalciferol (VITAMIN D3) 5000 units CAPS Take 1 capsule by mouth daily.    . Probiotic Product  (PROBIOTIC DAILY PO) Take by mouth.     No current facility-administered medications on file prior to visit.     BP 116/70   Pulse 73   Temp 97.8 F (36.6 C) (Tympanic)   Ht 5\' 6"  (1.676 m)   Wt 157 lb (71.2 kg)   SpO2 98%   BMI 25.34 kg/m    Objective:   Physical Exam  Constitutional: She appears well-nourished.  HENT:  Mouth/Throat: Oropharynx is clear and moist.  Two oral ulcers to oral cavity, one on underside of tongue and the other to the lip on the left inside  Respiratory: Effort normal. No respiratory distress.           Assessment & Plan:

## 2018-08-14 NOTE — Patient Instructions (Signed)
You can apply the triamcinolone 1% paste to the ulcers twice daily for one week, then as needed.  It was a pleasure to see you today!   Oral Ulcers Oral ulcers are small sores inside the mouth or near the mouth. They may occur on or inside the lips, inside the cheeks, on the tongue, or anywhere else inside or near the mouth. They may be called canker sores or cold sores, which are two types of oral ulcers. Many oral ulcers are harmless and go away on their own. In some cases, oral ulcers may require medical care to determine the cause and proper treatment. What are the causes? Common causes of this condition include:  Infections caused by viruses, bacteria, or fungi.  Emotional stress.  Foods or chemicals that irritate the mouth.  Injury or physical irritation of the mouth.  Medicines.  Allergies.  Tobacco use. Less common causes include:  Skin disease.  A type of herpes virus infection (herpes simplexor herpes zoster).  Oral cancer. In some cases, the cause may not be known. What increases the risk? You are more likely to develop this condition if:  You wear dental braces, dentures, or retainers.  You have poor oral hygiene.  You have sensitive skin.  You have a condition that affects the entire body (systemic condition), such as an immune disorder. What are the signs or symptoms? The main symptom of this condition is having one or more oval-shaped or round ulcers that have red borders. Symptoms may vary depending on the cause. This includes:  Location of the ulcers. Ulcers may be found inside the mouth, on the gums, or on the insides of the lips or cheeks. They may also be found on the lips or on skin that is near the mouth, such as the cheeks or chin.  Pain. Ulcers can be painful and uncomfortable, or they can be painless.  Appearance of the ulcers. They may look like red blisters and be filled with fluid, or they may be white or yellow patches.  Frequency of  outbreaks. Ulcers may go away permanently after one outbreak, or they may come back (recur) often or rarely. How is this diagnosed? This condition is diagnosed with a physical exam. Your health care provider may ask you questions about your lifestyle and your medical history. You may have tests, including:  Blood tests.  Removal of a small number of cells from an ulcer to be examined under a microscope (biopsy). How is this treated? Treatment depends on the severity and cause of the condition. Oral ulcers often go away on their own in 1-2 weeks. Treatment may include medicines, such as:  Medicines to treat a viral infection (antivirals), a bacterial infection (antibiotics), or a fungal infection (antifungals).  Medicines to help control pain. This may include: ? Over-the-counter pain medicines. ? Gel, cream, or spray to numb the area (topical anesthetic) if you have severe pain. ? Other medicines to coat or numb your mouth. Follow these instructions at home: Medicines  Take or use over-the-counter and prescription medicines only as told by your health care provider.  If you were prescribed an antibiotic medicine, take it as told by your health care provider. Do not stop taking the antibiotic even if you start to feel better.  Do not use products that contain benzocaine (including numbing gels) to treat teething or mouth pain in children who are younger than 2 years. These products may cause a rare but serious blood condition. Eating and drinking  Eat a balanced diet. Do not eat: ? Spicy foods. ? Citrus, such as oranges. ? Other foods and drinks that you think may cause or irritate your ulcers.  Drink enough fluid to keep your urine pale yellow.  Avoid drinking alcohol. Lifestyle   Practice good oral hygiene: ? Gently brush your teeth with a soft toothbrush two times a day. ? Floss your teeth every day. ? Get regular dental cleanings and checkups.  Do not use any products  that contain nicotine or tobacco, such as cigarettes and e-cigarettes. If you need help quitting, ask your health care provider. Managing pain  If directed, put ice on your face in the affected area to help reduce pain. ? Put ice in a plastic bag. ? Place a towel between your skin and the bag. ? Leave the ice on for 20 minutes, 2-3 times a day.  Avoid physical or chemical irritants that may have caused the ulcers or made them worse, such as mouthwashes that contain alcohol (ethanol). If you wear dental braces, dentures, or retainers, work with your health care provider to make sure these devices are fitted correctly.  If you were prescribed a prescription mouthwash to help reduce pain in your mouth, use it as told by your health care provider. General instructions  Rinse with a salt-water mixture 3-4 times a day or as told by your health care provider. To make a salt-water mixture, completely dissolve -1 tsp (3-6 g) of salt in 1 cup (237 mL) of warm water.  Keep all follow-up visits as told by your health care provider. This is important. Contact a health care provider if:  You have: ? Pain that gets worse or does not get better with medicine. ? Four or more ulcers at one time. ? A fever. ? New ulcers that look or feel different from other ulcers you have. ? Inflammation in one eye or both eyes. ? Ulcers that do not go away after 10 days.  You develop new symptoms in your mouth, such as: ? Bleeding or crusting around your lips or gums. ? Tooth pain. ? Difficulty swallowing.  You develop symptoms on your skin or genitals, such as: ? A rash or blisters. ? Burning or itching sensations.  Your ulcers begin or get worse after you start a new medicine. Get help right away if you have:  Difficulty breathing.  Swelling in your face or neck.  Excessive bleeding from your mouth.  Severe pain. Summary  Oral ulcers may occur anywhere inside or near the mouth.  They can be caused  by many things, such as infections, stress, injury or irritation, or tobacco use.  Oral ulcers can be painful or painless.  Treatment may include medicines to relieve pain or to treat an infection (if appropriate).  Most oral ulcers go away in 1-2 weeks. This information is not intended to replace advice given to you by your health care provider. Make sure you discuss any questions you have with your health care provider. Document Released: 03/10/2004 Document Revised: 06/15/2017 Document Reviewed: 06/15/2017 Elsevier Patient Education  2020 [employer].

## 2018-09-07 ENCOUNTER — Encounter: Payer: BC Managed Care – PPO | Admitting: Primary Care

## 2018-09-13 ENCOUNTER — Ambulatory Visit (INDEPENDENT_AMBULATORY_CARE_PROVIDER_SITE_OTHER): Payer: BC Managed Care – PPO | Admitting: Primary Care

## 2018-09-13 ENCOUNTER — Other Ambulatory Visit: Payer: Self-pay

## 2018-09-13 VITALS — BP 116/74 | HR 76 | Temp 97.8°F | Ht 66.0 in | Wt 156.0 lb

## 2018-09-13 DIAGNOSIS — R7303 Prediabetes: Secondary | ICD-10-CM

## 2018-09-13 DIAGNOSIS — N8110 Cystocele, unspecified: Secondary | ICD-10-CM

## 2018-09-13 DIAGNOSIS — G43909 Migraine, unspecified, not intractable, without status migrainosus: Secondary | ICD-10-CM

## 2018-09-13 DIAGNOSIS — Z0000 Encounter for general adult medical examination without abnormal findings: Secondary | ICD-10-CM | POA: Diagnosis not present

## 2018-09-13 DIAGNOSIS — Z1239 Encounter for other screening for malignant neoplasm of breast: Secondary | ICD-10-CM

## 2018-09-13 NOTE — Assessment & Plan Note (Addendum)
A1C of 6.1 on recent labs, slightly worse than last year. Family history of diabetes in mother.  Recommended to increase exercise, continue to work on diet.  Continue to monitor, repeat in 6 months.

## 2018-09-13 NOTE — Patient Instructions (Addendum)
Continue exercising. You should be getting 150 minutes of moderate intensity exercise weekly.  Continue to work on [employer].  Ensure you are consuming 64 ounces of water daily.  Schedule your mammogram as discussed.  Schedule a lab appointment for 6 months to repeat the A1C test.  It was a pleasure to see you today!   Preventive Care 48-60 Years Old, Female Preventive care refers to visits with your health care provider and lifestyle choices that can promote health and wellness. This includes:  A yearly physical exam. This may also be called an annual well check.  Regular dental visits and eye exams.  Immunizations.  Screening for certain conditions.  Healthy lifestyle choices, such as eating a healthy diet, getting regular exercise, not using drugs or products that contain nicotine and tobacco, and limiting alcohol use. What can I expect for my preventive care visit? Physical exam Your health care provider will check your:  Height and weight. This may be used to calculate body mass index (BMI), which tells if you are at a healthy weight.  Heart rate and blood pressure.  Skin for abnormal spots. Counseling Your health care provider may ask you questions about your:  Alcohol, tobacco, and drug use.  Emotional well-being.  Home and relationship well-being.  Sexual activity.  Eating habits.  Work and work Statistician.  Method of birth control.  Menstrual cycle.  Pregnancy history. What immunizations do I need?  Influenza (flu) vaccine  This is recommended every year. Tetanus, diphtheria, and pertussis (Tdap) vaccine  You may need a Td booster every 10 years. Varicella (chickenpox) vaccine  You may need this if you have not been vaccinated. Zoster (shingles) vaccine  You may need this after age 92. Measles, mumps, and rubella (MMR) vaccine  You may need at least one dose of MMR if you were born in 1957 or later. You may also need a second  dose. Pneumococcal conjugate (PCV13) vaccine  You may need this if you have certain conditions and were not previously vaccinated. Pneumococcal polysaccharide (PPSV23) vaccine  You may need one or two doses if you smoke cigarettes or if you have certain conditions. Meningococcal conjugate (MenACWY) vaccine  You may need this if you have certain conditions. Hepatitis A vaccine  You may need this if you have certain conditions or if you travel or work in places where you may be exposed to hepatitis A. Hepatitis B vaccine  You may need this if you have certain conditions or if you travel or work in places where you may be exposed to hepatitis B. Haemophilus influenzae type b (Hib) vaccine  You may need this if you have certain conditions. Human papillomavirus (HPV) vaccine  If recommended by your health care provider, you may need three doses over 6 months. You may receive vaccines as individual doses or as more than one vaccine together in one shot (combination vaccines). Talk with your health care provider about the risks and benefits of combination vaccines. What tests do I need? Blood tests  Lipid and cholesterol levels. These may be checked every 5 years, or more frequently if you are over 54 years old.  Hepatitis C test.  Hepatitis B test. Screening  Lung cancer screening. You may have this screening every year starting at age 85 if you have a 30-pack-year history of smoking and currently smoke or have quit within the past 15 years.  Colorectal cancer screening. All adults should have this screening starting at age 65 and continuing until  age 42. Your health care provider may recommend screening at age 4 if you are at increased risk. You will have tests every 1-10 years, depending on your results and the type of screening test.  Diabetes screening. This is done by checking your blood sugar (glucose) after you have not eaten for a while (fasting). You may have this done every  1-3 years.  Mammogram. This may be done every 1-2 years. Talk with your health care provider about when you should start having regular mammograms. This may depend on whether you have a family history of breast cancer.  BRCA-related cancer screening. This may be done if you have a family history of breast, ovarian, tubal, or peritoneal cancers.  Pelvic exam and Pap test. This may be done every 3 years starting at age 51. Starting at age 53, this may be done every 5 years if you have a Pap test in combination with an HPV test. Other tests  Sexually transmitted disease (STD) testing.  Bone density scan. This is done to screen for osteoporosis. You may have this scan if you are at high risk for osteoporosis. Follow these instructions at home: Eating and drinking  Eat a diet that includes fresh fruits and vegetables, whole grains, lean protein, and low-fat dairy.  Take vitamin and mineral supplements as recommended by your health care provider.  Do not drink alcohol if: ? Your health care provider tells you not to drink. ? You are pregnant, may be pregnant, or are planning to become pregnant.  If you drink alcohol: ? Limit how much you have to 0-1 drink a day. ? Be aware of how much alcohol is in your drink. In the [place] one drink equals one 12 oz bottle of beer (355 mL), one 5 oz glass of wine (148 mL), or one 1 oz glass of hard liquor (44 mL). Lifestyle  Take daily care of your teeth and gums.  Stay active. Exercise for at least 30 minutes on 5 or more days each week.  Do not use any products that contain nicotine or tobacco, such as cigarettes, e-cigarettes, and chewing tobacco. If you need help quitting, ask your health care provider.  If you are sexually active, practice safe sex. Use a condom or other form of birth control (contraception) in order to prevent pregnancy and STIs (sexually transmitted infections).  If told by your health care provider, take low-dose aspirin daily  starting at age 4. What's next?  Visit your health care provider once a year for a well check visit.  Ask your health care provider how often you should have your eyes and teeth checked.  Stay up to date on all vaccines. This information is not intended to replace advice given to you by your health care provider. Make sure you discuss any questions you have with your health care provider. Document Released: 02/27/2015 Document Revised: 10/12/2017 Document Reviewed: 10/12/2017 Elsevier Patient Education  2020 [employer].

## 2018-09-13 NOTE — Assessment & Plan Note (Signed)
Immunizations UTD. Pap smear UTD, due in 2021. Mammogram overdue, pending. Colonoscopy due in 2022. Discussed to work on regular exercise, healthy diet. Exam unremarkable. Labs reviewed.

## 2018-09-13 NOTE — Assessment & Plan Note (Signed)
No recent migraines, doing well on OTC treatment. Continue same.

## 2018-09-13 NOTE — Assessment & Plan Note (Signed)
Continued, tries to avoid lifting heavy objects.  Offered options for treatment including Urology/GYN referral, pessary, pelvic floor exercises. She kindly declines and will notify when ready. Discussed [place]egal Exercises.

## 2018-09-13 NOTE — Progress Notes (Signed)
Subjective:    Patient ID: Claudia Dawson, female    DOB: 05-Jul-1960, 58 y.o.   MRN: [id number]  HPI  Claudia Dawson is a 58 year old female who presents today for complete physical.  Immunizations: -Tetanus: Completed in 2015 -Influenza: Due this season  -Shingrix: Completed  Diet: She endorses a fair diet. Most eating home cooked meals, lean protein/vegetable/starch. Desserts 2-3 times weekly. Drinking water mostly, occasional sweet tea and diet soda. Little wine. Exercise: She is is doing walking aerobics with resistance training three days weekly  Eye exam: Completed in 2019 Dental exam: Completes semi-annually  Colonoscopy: Completed in 2012 Pap Smear: Completed in 2018 Mammogram: April of 2019  BP Readings from Last 3 Encounters:  09/13/18 116/74  08/14/18 116/70  12/20/17 116/70     Review of Systems  Constitutional: Negative for unexpected weight change.  HENT: Negative for rhinorrhea.   Respiratory: Negative for cough and shortness of breath.   Cardiovascular: Negative for chest pain.  Gastrointestinal: Negative for constipation and diarrhea.  Genitourinary: Negative for difficulty urinating.  Musculoskeletal: Negative for arthralgias and myalgias.  Skin: Negative for rash.  Allergic/Immunologic: Negative for environmental allergies.  Neurological: Negative for dizziness, numbness and headaches.  Psychiatric/Behavioral: The patient is not nervous/anxious.        Past Medical History:  Diagnosis Date  . Allergy   . Prediabetes      Social History   Socioeconomic History  . Marital status: Widowed    Spouse name: Not on file  . Number of children: Not on file  . Years of education: Not on file  . Highest education level: Not on file  Occupational History  . Not on file  Social Needs  . Financial resource strain: Not on file  . Food insecurity    Worry: Not on file    Inability: Not on file  . Transportation needs    Medical: Not on  file    Non-medical: Not on file  Tobacco Use  . Smoking status: Never Smoker  . Smokeless tobacco: Never Used  Substance and Sexual Activity  . Alcohol use: No  . Drug use: No  . Sexual activity: Not on file  Lifestyle  . Physical activity    Days per week: Not on file    Minutes per session: Not on file  . Stress: Not on file  Relationships  . Social Herbalist on phone: Not on file    Gets together: Not on file    Attends religious service: Not on file    Active member of club or organization: Not on file    Attends meetings of clubs or organizations: Not on file    Relationship status: Not on file  . Intimate partner violence    Fear of current or ex partner: Not on file    Emotionally abused: Not on file    Physically abused: Not on file    Forced sexual activity: Not on file  Other Topics Concern  . Not on file  Social History Narrative   Lives in [place].    Husband died of MI at age 66 in 03/08/2012.    Daughters live nearby.   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   Exercise - walks and lifts weights with group from church.    Past Surgical History:  Procedure Laterality Date  . BREAST CYST ASPIRATION Bilateral   . CESAREAN SECTION    . TUBAL LIGATION  Family History  Problem Relation Age of Onset  . Hypertension Father   . Atrial fibrillation Father   . Heart disease Father        afib  . Ulcers Father   . Cancer Maternal Grandmother        Endometrial  . Stroke Paternal Grandmother   . Cancer Brother        kidney, unsure if malignant tumor  . Cancer Maternal Aunt        half-aunt breast cancer  . Breast cancer Neg Hx     No Known Allergies  Current Outpatient Medications on File Prior to Visit  Medication Sig Dispense Refill  . b complex vitamins tablet Take 1 tablet by mouth daily.    . Cholecalciferol (VITAMIN D3) 5000 units CAPS Take 1 capsule by mouth daily.    . Probiotic Product (PROBIOTIC DAILY PO) Take by  mouth.     No current facility-administered medications on file prior to visit.     BP 116/74   Pulse 76   Temp 97.8 F (36.6 C) (Temporal)   Ht 5\' 6"  (1.676 m)   Wt 156 lb (70.8 kg)   SpO2 98%   BMI 25.18 kg/m    Objective:   Physical Exam  Constitutional: She is oriented to person, place, and time. She appears well-nourished.  HENT:  Mouth/Throat: No oropharyngeal exudate.  Eyes: Pupils are equal, round, and reactive to light. EOM are normal.  Neck: Neck supple. No thyromegaly present.  Cardiovascular: Normal rate and regular rhythm.  Respiratory: Effort normal and breath sounds normal.  GI: Soft. Bowel sounds are normal. There is no abdominal tenderness.  Musculoskeletal: Normal range of motion.  Neurological: She is alert and oriented to person, place, and time.  Skin: Skin is warm and dry.  Psychiatric: She has a normal mood and affect.           Assessment & Plan:

## 2018-09-13 NOTE — Assessment & Plan Note (Signed)
Orders placed for mammogram.

## 2019-07-16 ENCOUNTER — Ambulatory Visit
Admission: RE | Admit: 2019-07-16 | Discharge: 2019-07-16 | Disposition: A | Discharge status: Home / Self Care | Payer: BC Managed Care – PPO | Source: Ambulatory Visit | Attending: Primary Care | Admitting: Primary Care

## 2019-07-16 DIAGNOSIS — Z1231 Encounter for screening mammogram for malignant neoplasm of breast: Secondary | ICD-10-CM | POA: Diagnosis not present

## 2019-07-16 DIAGNOSIS — Z1239 Encounter for other screening for malignant neoplasm of breast: Secondary | ICD-10-CM

## 2019-08-01 ENCOUNTER — Encounter: Payer: Self-pay | Admitting: Family Medicine

## 2019-08-01 ENCOUNTER — Ambulatory Visit: Payer: BC Managed Care – PPO | Admitting: Family Medicine

## 2019-08-01 ENCOUNTER — Other Ambulatory Visit: Payer: Self-pay

## 2019-08-01 VITALS — BP 130/70 | HR 72 | Temp 98.1°F | Ht 66.0 in | Wt 149.0 lb

## 2019-08-01 DIAGNOSIS — R002 Palpitations: Secondary | ICD-10-CM | POA: Diagnosis not present

## 2019-08-01 DIAGNOSIS — R7303 Prediabetes: Secondary | ICD-10-CM | POA: Diagnosis not present

## 2019-08-01 LAB — CBC WITH DIFFERENTIAL/PLATELET
Basophils Absolute: 0 10*3/uL (ref 0.0–0.1)
Basophils Relative: 0.4 % (ref 0.0–3.0)
Eosinophils Absolute: 0 10*3/uL (ref 0.0–0.7)
Eosinophils Relative: 0.2 % (ref 0.0–5.0)
HCT: 37.2 % (ref 36.0–46.0)
Hemoglobin: 12.4 g/dL (ref 12.0–15.0)
Lymphocytes Relative: 24.3 % (ref 12.0–46.0)
Lymphs Abs: 1.5 10*3/uL (ref 0.7–4.0)
MCHC: 33.4 g/dL (ref 30.0–36.0)
MCV: 92.2 fl (ref 78.0–100.0)
Monocytes Absolute: 0.5 10*3/uL (ref 0.1–1.0)
Monocytes Relative: 8.6 % (ref 3.0–12.0)
Neutro Abs: 4.1 10*3/uL (ref 1.4–7.7)
Neutrophils Relative %: 66.5 % (ref 43.0–77.0)
Platelets: 226 10*3/uL (ref 150.0–400.0)
RBC: 4.03 Mil/uL (ref 3.87–5.11)
RDW: 13.4 % (ref 11.5–15.5)
WBC: 6.2 10*3/uL (ref 4.0–10.5)

## 2019-08-01 LAB — LIPID PANEL
Cholesterol: 201 mg/dL — ABNORMAL HIGH (ref 0–200)
HDL: 76.7 mg/dL (ref >39.00)
LDL Cholesterol: 110 mg/dL — ABNORMAL HIGH (ref 0–99)
NonHDL: 124.39
Total CHOL/HDL Ratio: 3
Triglycerides: 71 mg/dL (ref 0.0–149.0)
VLDL: 14.2 mg/dL (ref 0.0–40.0)

## 2019-08-01 LAB — BASIC METABOLIC PANEL
BUN: 10 mg/dL (ref 6–23)
CO2: 29 mEq/L (ref 19–32)
Calcium: 9.8 mg/dL (ref 8.4–10.5)
Chloride: 106 mEq/L (ref 96–112)
Creatinine, Ser: 0.72 mg/dL (ref 0.40–1.20)
GFR: 82.73 mL/min (ref >60.00)
Glucose, Bld: 98 mg/dL (ref 70–99)
Potassium: 4.4 mEq/L (ref 3.5–5.1)
Sodium: 143 mEq/L (ref 135–145)

## 2019-08-01 LAB — HEPATIC FUNCTION PANEL
ALT: 19 U/L (ref 0–35)
AST: 18 U/L (ref 0–37)
Albumin: 4.5 g/dL (ref 3.5–5.2)
Alkaline Phosphatase: 72 U/L (ref 39–117)
Bilirubin, Direct: 0.1 mg/dL (ref 0.0–0.3)
Total Bilirubin: 0.4 mg/dL (ref 0.2–1.2)
Total Protein: 7.2 g/dL (ref 6.0–8.3)

## 2019-08-01 LAB — HEMOGLOBIN A1C: Hgb A1c MFr Bld: 5.8 % (ref 4.6–6.5)

## 2019-08-01 LAB — T4, FREE: Free T4: 1.06 ng/dL (ref 0.60–1.60)

## 2019-08-01 LAB — TSH: TSH: 1.28 u[IU]/mL (ref 0.35–4.50)

## 2019-08-01 LAB — T3, FREE: T3, Free: 3.2 pg/mL (ref 2.3–4.2)

## 2019-08-01 NOTE — Progress Notes (Signed)
Declynn Lopresti T. Britt Theard, MD, CAQ Sports Medicine  Primary Care and Sports Medicine [facility] at [facility] [address] [place], [address]  Phone: [phone]   FAX: [phone]  Claudia Dawson - 59 y.o. female   MRN [id number]   Date of Birth: Mar 09, 1960  Date: 08/01/2019   PCP: Doreene Nest, NP   Referral: Doreene Nest, NP  Chief Complaint  Patient presents with   Palpitations    This visit occurred during the SARS-CoV-2 public health emergency.  Safety protocols were in place, including screening questions prior to the visit, additional usage of staff PPE, and extensive cleaning of exam room while observing appropriate contact time as indicated for disinfecting solutions.   Subjective:   Claudia Dawson is a 59 y.o. very pleasant female patient with Body mass index is 24.05 kg/m. who presents with the following:  Has been feeling some heart palpitations.  Has had some hot flashes.  This last month and morning and other times of the day.  It will last sometimes too fast, up and down some.  Sometimes it will be skipping a beat.  In the last few days, she did have an episode where she felt like she was having some palpitations and tachycardia that lasted for 1 hour.  She is also had times where it lasted only for a few minutes.  She has never felt like she was lightheaded or she was going to pass out.  She is globally a healthy woman with BMI 24 at age 51.  She does not have a history of significant anxiety or panic attacks.  For one hour this morning, felt like her heart rate is too high.  Tried to say calm.  Maybe 100 from sleeping. Question if skipping some beats.   Dad has an arhythmia and a fib. AAA Takes coumadin PGF had I and aicd Mom had an aneuryms  No tobacco No Drugs  Thyroid? Review of Systems is noted in the HPI, as appropriate  Objective:   BP 130/70    Pulse 72    Temp 98.1 F (36.7 C) (Temporal)     Ht 5\' 6"  (1.676 m)    Wt 149 lb (67.6 kg)    SpO2 98%    BMI 24.05 kg/m    GEN: WDWN, NAD, Non-toxic HEENT: Atraumatic, Normocephalic. Neck supple. No masses. CV: RRR, No M/G/R. No JVD. No thrill. No extra heart sounds. PULM: CTA B, no wheezes, crackles, rhonchi. No retractions. No resp. distress. No accessory muscle use. EXTR: No c/c/e NEURO Normal gait.  PSYCH: Normally interactive. Conversant.    Laboratory and Imaging Data: Results for orders placed or performed in visit on 08/01/19  T3, free  Result Value Ref Range   T3, Free 3.2 2.3 - 4.2 pg/mL  T4, free  Result Value Ref Range   Free T4 1.06 0.60 - 1.60 ng/dL  TSH  Result Value Ref Range   TSH 1.28 0.35 - 4.50 uIU/mL  Basic metabolic panel  Result Value Ref Range   Sodium 143 135 - 145 mEq/L   Potassium 4.4 3.5 - 5.1 mEq/L   Chloride 106 96 - 112 mEq/L   CO2 29 19 - 32 mEq/L   Glucose, Bld 98 70 - 99 mg/dL   BUN 10 6 - 23 mg/dL   Creatinine, Ser 08/03/19 0.40 - 1.20 mg/dL   GFR 8.10 17.51 mL/min   Calcium 9.8 8.4 - 10.5 mg/dL  CBC with Differential/Platelet  Result Value Ref Range   WBC 6.2 4.0 - 10.5 K/uL   RBC 4.03 3.87 - 5.11 Mil/uL   Hemoglobin 12.4 12.0 - 15.0 g/dL   HCT 67.8 36 - 46 %   MCV 92.2 78.0 - 100.0 fl   MCHC 33.4 30.0 - 36.0 g/dL   RDW 93.8 10.1 - 75.1 %   Platelets 226.0 150 - 400 K/uL   Neutrophils Relative % 66.5 43 - 77 %   Lymphocytes Relative 24.3 12 - 46 %   Monocytes Relative 8.6 3 - 12 %   Eosinophils Relative 0.2 0 - 5 %   Basophils Relative 0.4 0 - 3 %   Neutro Abs 4.1 1.4 - 7.7 K/uL   Lymphs Abs 1.5 0.7 - 4.0 K/uL   Monocytes Absolute 0.5 0 - 1 K/uL   Eosinophils Absolute 0.0 0 - 0 K/uL   Basophils Absolute 0.0 0 - 0 K/uL  Hepatic function panel  Result Value Ref Range   Total Bilirubin 0.4 0.2 - 1.2 mg/dL   Bilirubin, Direct 0.1 0.0 - 0.3 mg/dL   Alkaline Phosphatase 72 39 - 117 U/L   AST 18 0 - 37 U/L   ALT 19 0 - 35 U/L   Total Protein 7.2 6.0 - 8.3 g/dL   Albumin 4.5  3.5 - 5.2 g/dL  Lipid panel  Result Value Ref Range   Cholesterol 201 (H) 0 - 200 mg/dL   Triglycerides 02.5 0 - 149 mg/dL   HDL 85.27 >78.24 mg/dL   VLDL 23.5 0.0 - 36.1 mg/dL   LDL Cholesterol 443 (H) 0 - 99 mg/dL   Total CHOL/HDL Ratio 3    NonHDL 124.39   Hemoglobin A1c  Result Value Ref Range   Hgb A1c MFr Bld 5.8 4.6 - 6.5 %     Assessment and Plan:     ICD-10-CM   1. Palpitations  R00.2 EKG 12-Lead    T3, free    T4, free    TSH    Basic metabolic panel    CBC with Differential/Platelet    Hepatic function panel    Lipid panel    Ambulatory referral to Cardiology  2. Prediabetes  R73.03 Hemoglobin A1c   EKG: Normal sinus rhythm. Normal axis, normal R wave progression, No acute ST elevation or depression.   In this setting with intermittent palpitations and tachycardia, as well as an episode that lasted 1 hour recently, think that she does need to have a more formal evaluation from cardiology to exclude a more significant arrhythmia.  I appreciate their help in this matter.  I repeated some baseline labs including thyroid function as well as A1c, these have returned normal with the exception of some mild hyperlipidemia.  Follow-up: No follow-ups on file.  No orders of the defined types were placed in this encounter.  There are no discontinued medications. Orders Placed This Encounter  Procedures   T3, free   T4, free   TSH   Basic metabolic panel   CBC with Differential/Platelet   Hepatic function panel   Lipid panel   Hemoglobin A1c   Ambulatory referral to Cardiology   EKG 12-Lead    Signed,  Karleen Hampshire T. Lisle Skillman, MD   Outpatient Encounter Medications as of 08/01/2019  Medication Sig   b complex vitamins tablet Take 1 tablet by mouth daily.   CALCIUM CITRATE PO Take 1 tablet by mouth. three times a week   Cholecalciferol (VITAMIN D3) 5000 units CAPS Take 1 capsule  by mouth daily.   Probiotic Product (PROBIOTIC DAILY PO) Take by mouth.    No facility-administered encounter medications on file as of 08/01/2019.

## 2019-08-23 ENCOUNTER — Ambulatory Visit: Payer: BC Managed Care – PPO | Admitting: Cardiology

## 2019-08-23 ENCOUNTER — Other Ambulatory Visit: Payer: Self-pay

## 2019-08-23 ENCOUNTER — Encounter: Payer: Self-pay | Admitting: Cardiology

## 2019-08-23 ENCOUNTER — Ambulatory Visit (INDEPENDENT_AMBULATORY_CARE_PROVIDER_SITE_OTHER): Payer: BC Managed Care – PPO

## 2019-08-23 VITALS — BP 90/68 | HR 66 | Ht 66.0 in | Wt 147.0 lb

## 2019-08-23 DIAGNOSIS — R002 Palpitations: Secondary | ICD-10-CM | POA: Diagnosis not present

## 2019-08-23 NOTE — Progress Notes (Signed)
Cardiology Office Note:    Date:  08/23/2019   ID:  [name], DOB 1960-09-14, MRN [id number]  PCP:  Doreene Nest, NP  [facility] Cardiologist:  Debbe Odea, MD  [facility] Electrophysiologist:  None   Referring MD: Hannah Beat, MD   Chief Complaint  Patient presents with  . New Patient (Initial Visit)    Ref by Dr. Patsy Lager for palpitations. Meds reviewed by the pt. verbally. Pt. c/o racing irreg. heart beats at times.    Claudia Dawson is a 59 y.o. female who is being seen today for the evaluation of palpitations at the request of Copland, Karleen Hampshire, MD.   History of Present Illness:    Claudia Dawson is a 59 y.o. female with no significant past medical history who presents due to palpitations.  Patient states having symptoms of palpitations for about 2 months now.  Symptoms are not related with exertion.  Symptoms have worsened over the past several weeks by lasting a little longer, up to 30 minutes.  Symptoms are not associated with dizziness, chest pain or shortness of breath.  She exercises 3 times a week and does a lot of walking without any cardiac symptoms of chest pain, shortness of breath palpitations dizziness, syncope.  She usually tries to relax to help with symptoms.  She thinks the culprit of her anxiety may be at play since her husband passed not too long ago.  She also notes occasional skipped heartbeats.  She is planning to go on a beach with her family this week for about a week.  She plans to get in the water with her granddaughter.  Her father has a history of atrial fibrillation.  Denies smoking.  Recent lab work showed normal thyroid function  Past Medical History:  Diagnosis Date  . Allergy   . Prediabetes     Past Surgical History:  Procedure Laterality Date  . BREAST CYST ASPIRATION Bilateral   . CESAREAN SECTION    . TUBAL LIGATION      Current Medications: Current Meds  Medication Sig  . b  complex vitamins tablet Take 1 tablet by mouth daily.  Marland Kitchen CALCIUM CITRATE PO Take 1 tablet by mouth. three times a week  . Cholecalciferol (VITAMIN D3) 5000 units CAPS Take 1 capsule by mouth daily.  . Probiotic Product (PROBIOTIC DAILY PO) Take by mouth.     Allergies:   Patient has no known allergies.   Social History   Socioeconomic History  . Marital status: Widowed    Spouse name: Not on file  . Number of children: Not on file  . Years of education: Not on file  . Highest education level: Not on file  Occupational History  . Not on file  Tobacco Use  . Smoking status: Never Smoker  . Smokeless tobacco: Never Used  Vaping Use  . Vaping Use: Never used  Substance and Sexual Activity  . Alcohol use: No  . Drug use: No  . Sexual activity: Not on file  Other Topics Concern  . Not on file  Social History Narrative   Lives in [place].    Husband died of MI at age 38 in 03-06-2012.    Daughters live nearby.   Work - Geologist, engineering for first grade.   Exercise - walks and lifts weights with group from church.   Social Determinants of Health   Financial Resource Strain:   . Difficulty of Paying Living Expenses:   Food  Insecurity:   . Worried About Programme researcher, broadcasting/film/video in the Last Year:   . Barista in the Last Year:   Transportation Needs:   . Freight forwarder (Medical):   Marland Kitchen Lack of Transportation (Non-Medical):   Physical Activity:   . Days of Exercise per Week:   . Minutes of Exercise per Session:   Stress:   . Feeling of Stress :   Social Connections:   . Frequency of Communication with Friends and Family:   . Frequency of Social Gatherings with Friends and Family:   . Attends Religious Services:   . Active Member of Clubs or Organizations:   . Attends Banker Meetings:   Marland Kitchen Marital Status:      Family History: The patient's family history includes Arrhythmia in her father; Atrial fibrillation in her father; Breast cancer in her  paternal grandmother; Cancer in her brother, maternal aunt, and maternal grandmother; Heart disease in her father; Hypertension in her father; Stroke in her paternal grandmother; Ulcers in her father.  ROS:   Please see the history of present illness.     All other systems reviewed and are negative.  EKGs/Labs/Other Studies Reviewed:    The following studies were reviewed today:   EKG:  EKG is  ordered today.  The ekg ordered today demonstrates normal sinus rhythm  Recent Labs: 08/01/2019: ALT 19; BUN 10; Creatinine, Ser 0.72; Hemoglobin 12.4; Platelets 226.0; Potassium 4.4; Sodium 143; TSH 1.28  Recent Lipid Panel    Component Value Date/Time   CHOL 201 (H) 08/01/2019 1217   TRIG 71.0 08/01/2019 1217   HDL 76.70 08/01/2019 1217   CHOLHDL 3 08/01/2019 1217   VLDL 14.2 08/01/2019 1217   LDLCALC 110 (H) 08/01/2019 1217    Physical Exam:    VS:  BP 90/68 (BP Location: Right Arm, Patient Position: Sitting, Cuff Size: Normal)   Pulse 66   Ht 5\' 6"  (1.676 m)   Wt 147 lb (66.7 kg)   SpO2 99%   BMI 23.73 kg/m     Wt Readings from Last 3 Encounters:  08/23/19 147 lb (66.7 kg)  08/01/19 149 lb (67.6 kg)  09/13/18 156 lb (70.8 kg)     GEN:  Well nourished, well developed in no acute distress HEENT: Normal NECK: No JVD; No carotid bruits LYMPHATICS: No lymphadenopathy CARDIAC: RRR, no murmurs, rubs, gallops RESPIRATORY:  Clear to auscultation without rales, wheezing or rhonchi  ABDOMEN: Soft, non-tender, non-distended MUSCULOSKELETAL:  No edema; No deformity  SKIN: Warm and dry NEUROLOGIC:  Alert and oriented x 3 PSYCHIATRIC:  Normal affect   ASSESSMENT:    1. Palpitations    PLAN:    In order of problems listed above:  1. Patient with history of palpitations.  She has no cardiac risk factors, denies dizziness, chest pain or shortness of breath with symptoms.  We will place a cardiac monitor x2 weeks to evaluate for any significant arrhythmias.    Follow-up after  cardiac monitor.  This note was generated in part or whole with voice recognition software. Voice recognition is usually quite accurate but there are transcription errors that can and very often do occur. I apologize for any typographical errors that were not detected and corrected.   Medication Adjustments/Labs and Tests Ordered: Current medicines are reviewed at length with the patient today.  Concerns regarding medicines are outlined above.  Orders Placed This Encounter  Procedures  . LONG TERM MONITOR (3-14 DAYS)  . EKG  12-Lead   No orders of the defined types were placed in this encounter.   Patient Instructions  Medication Instructions:   Your physician recommends that you continue on your current medications as directed. Please refer to the Current Medication list given to you today.   *If you need a refill on your cardiac medications before your next appointment, please call your pharmacy*   Lab Work: None Ordered If you have labs (blood work) drawn today and your tests are completely normal, you will receive your results only by: Marland Kitchen MyChart Message (if you have MyChart) OR . A paper copy in the mail If you have any lab test that is abnormal or we need to change your treatment, we will call you to review the results.   Testing/Procedures:  Your physician has recommended that you wear a Zio monitor. This will be mailed to you with an approximate date of 08/31/19.This monitor is a medical device that records the heart's electrical activity. Doctors most often use these monitors to diagnose arrhythmias. Arrhythmias are problems with the speed or rhythm of the heartbeat. The monitor is a small device applied to your chest. You can wear one while you do your normal daily activities. While wearing this monitor if you have any symptoms to push the button and record what you felt. Once you have worn this monitor for the period of time provider prescribed (Usually 14 days), you will  return the monitor device in the postage paid box. Once it is returned they will download the data collected and provide [place] with a report which the provider will then review and we will call you with those results. Important tips:  1. Avoid showering during the first 24 hours of wearing the monitor. 2. Avoid excessive sweating to help maximize wear time. 3. Do not submerge the device, no hot tubs, and no swimming pools. 4. Keep any lotions or oils away from the patch. 5. After 24 hours you may shower with the patch on. Take brief showers with your back facing the shower head.  6. Do not remove patch once it has been placed because that will interrupt data and decrease adhesive wear time. 7. Push the button when you have any symptoms and write down what you were feeling. 8. Once you have completed wearing your monitor, remove and place into box which has postage paid and place in your outgoing mailbox.  9. If for some reason you have misplaced your box then call our office and we can provide another box and/or mail it off for you.         Follow-Up: At [facility], you and your health needs are our priority.  As part of our continuing mission to provide you with exceptional heart care, we have created designated Provider Care Teams.  These Care Teams include your primary Cardiologist (physician) and Advanced Practice Providers (APPs -  Physician Assistants and Nurse Practitioners) who all work together to provide you with the care you need, when you need it.  We recommend signing up for the patient portal called "MyChart".  Sign up information is provided on this After Visit Summary.  MyChart is used to connect with patients for Virtual Visits (Telemedicine).  Patients are able to view lab/test results, encounter notes, upcoming appointments, etc.  Non-urgent messages can be sent to your provider as well.   To learn more about what you can do with MyChart, go to [email or web address].     Your next appointment:  6 week(s)  The format for your next appointment:   In Person  Provider:   Debbe OdeaBrian Agbor-Etang, MD   Other Instructions N/A      Signed, Debbe OdeaBrian Agbor-Etang, MD  08/23/2019 1:01 PM    [facility]

## 2019-08-23 NOTE — Patient Instructions (Signed)
Medication Instructions:   Your physician recommends that you continue on your current medications as directed. Please refer to the Current Medication list given to you today.   *If you need a refill on your cardiac medications before your next appointment, please call your pharmacy*   Lab Work: None Ordered If you have labs (blood work) drawn today and your tests are completely normal, you will receive your results only by: Marland Kitchen MyChart Message (if you have MyChart) OR . A paper copy in the mail If you have any lab test that is abnormal or we need to change your treatment, we will call you to review the results.   Testing/Procedures:  Your physician has recommended that you wear a Zio monitor. This will be mailed to you with an approximate date of 08/31/19.This monitor is a medical device that records the heart's electrical activity. Doctors most often use these monitors to diagnose arrhythmias. Arrhythmias are problems with the speed or rhythm of the heartbeat. The monitor is a small device applied to your chest. You can wear one while you do your normal daily activities. While wearing this monitor if you have any symptoms to push the button and record what you felt. Once you have worn this monitor for the period of time provider prescribed (Usually 14 days), you will return the monitor device in the postage paid box. Once it is returned they will download the data collected and provide [place] with a report which the provider will then review and we will call you with those results. Important tips:  1. Avoid showering during the first 24 hours of wearing the monitor. 2. Avoid excessive sweating to help maximize wear time. 3. Do not submerge the device, no hot tubs, and no swimming pools. 4. Keep any lotions or oils away from the patch. 5. After 24 hours you may shower with the patch on. Take brief showers with your back facing the shower head.  6. Do not remove patch once it has been placed  because that will interrupt data and decrease adhesive wear time. 7. Push the button when you have any symptoms and write down what you were feeling. 8. Once you have completed wearing your monitor, remove and place into box which has postage paid and place in your outgoing mailbox.  9. If for some reason you have misplaced your box then call our office and we can provide another box and/or mail it off for you.         Follow-Up: At [facility], you and your health needs are our priority.  As part of our continuing mission to provide you with exceptional heart care, we have created designated Provider Care Teams.  These Care Teams include your primary Cardiologist (physician) and Advanced Practice Providers (APPs -  Physician Assistants and Nurse Practitioners) who all work together to provide you with the care you need, when you need it.  We recommend signing up for the patient portal called "MyChart".  Sign up information is provided on this After Visit Summary.  MyChart is used to connect with patients for Virtual Visits (Telemedicine).  Patients are able to view lab/test results, encounter notes, upcoming appointments, etc.  Non-urgent messages can be sent to your provider as well.   To learn more about what you can do with MyChart, go to [email or web address].    Your next appointment:   6 week(s)  The format for your next appointment:   In Person  Provider:   Debbe Odea, MD  Other Instructions N/A

## 2019-09-25 ENCOUNTER — Other Ambulatory Visit: Payer: Self-pay

## 2019-09-26 ENCOUNTER — Telehealth: Payer: Self-pay

## 2019-09-26 DIAGNOSIS — R0600 Dyspnea, unspecified: Secondary | ICD-10-CM

## 2019-09-26 DIAGNOSIS — R002 Palpitations: Secondary | ICD-10-CM

## 2019-09-26 NOTE — Telephone Encounter (Signed)
Attempted to call patient. LMTCB 09/26/2019   

## 2019-09-26 NOTE — Telephone Encounter (Signed)
-----   Message from Debbe Odea, MD sent at 09/25/2019  6:23 PM EDT ----- Occasional SVTs noted, associated with patient triggered events. Please schedule patient to have echocardiogram. Keep follow-up appointment.

## 2019-10-01 ENCOUNTER — Other Ambulatory Visit: Payer: Self-pay

## 2019-10-01 ENCOUNTER — Ambulatory Visit (HOSPITAL_COMMUNITY): Payer: BC Managed Care – PPO | Attending: Internal Medicine

## 2019-10-01 DIAGNOSIS — R0600 Dyspnea, unspecified: Secondary | ICD-10-CM

## 2019-10-01 LAB — ECHOCARDIOGRAM COMPLETE
Area-P 1/2: 3.27 cm2
S' Lateral: 2.6 cm

## 2019-10-02 ENCOUNTER — Telehealth: Payer: Self-pay

## 2019-10-02 NOTE — Telephone Encounter (Signed)
-----   Message from Debbe Odea, MD sent at 10/02/2019  2:55 PM EDT ----- Normal systolic and diastolic function.

## 2019-10-02 NOTE — Telephone Encounter (Signed)
Call to patient to review echo.    Pt verbalized understanding and has no further questions at this time.    Advised pt to call for any further questions or concerns.  No further orders.  Confirmed upcoming appt next Monday.

## 2019-10-07 ENCOUNTER — Ambulatory Visit (INDEPENDENT_AMBULATORY_CARE_PROVIDER_SITE_OTHER): Payer: BC Managed Care – PPO | Admitting: Cardiology

## 2019-10-07 ENCOUNTER — Encounter: Payer: Self-pay | Admitting: Cardiology

## 2019-10-07 ENCOUNTER — Other Ambulatory Visit: Payer: Self-pay

## 2019-10-07 VITALS — BP 100/64 | HR 81 | Ht 66.0 in | Wt 151.2 lb

## 2019-10-07 DIAGNOSIS — I471 Supraventricular tachycardia: Secondary | ICD-10-CM

## 2019-10-07 MED ORDER — METOPROLOL SUCCINATE ER 25 MG PO TB24
12.5000 mg | ORAL_TABLET | Freq: Every day | ORAL | 3 refills | Status: DC
Start: 2019-10-07 — End: 2020-06-22

## 2019-10-07 MED ORDER — METOPROLOL SUCCINATE ER 25 MG PO TB24
12.5000 mg | ORAL_TABLET | Freq: Every day | ORAL | 3 refills | Status: DC
Start: 2019-10-07 — End: 2019-10-07

## 2019-10-07 NOTE — Patient Instructions (Signed)
Medication Instructions:   Your physician has recommended you make the following change in your medication:   1.  START taking metoprolol succinate (TOPROL XL) 25 MG 24 hr tablet:  Take 0.5 tablets (12.5 mg total) by mouth daily.  *If you need a refill on your cardiac medications before your next appointment, please call your pharmacy*   Lab Work: None Ordered If you have labs (blood work) drawn today and your tests are completely normal, you will receive your results only by: Marland Kitchen MyChart Message (if you have MyChart) OR . A paper copy in the mail If you have any lab test that is abnormal or we need to change your treatment, we will call you to review the results.   Testing/Procedures: None Ordered   Follow-Up: At [facility], you and your health needs are our priority.  As part of our continuing mission to provide you with exceptional heart care, we have created designated Provider Care Teams.  These Care Teams include your primary Cardiologist (physician) and Advanced Practice Providers (APPs -  Physician Assistants and Nurse Practitioners) who all work together to provide you with the care you need, when you need it.  We recommend signing up for the patient portal called "MyChart".  Sign up information is provided on this After Visit Summary.  MyChart is used to connect with patients for Virtual Visits (Telemedicine).  Patients are able to view lab/test results, encounter notes, upcoming appointments, etc.  Non-urgent messages can be sent to your provider as well.   To learn more about what you can do with MyChart, go to [email or web address].    Your next appointment:   2 month(s)  The format for your next appointment:   In Person  Provider:   Debbe Odea, MD   Other Instructions

## 2019-10-07 NOTE — Progress Notes (Signed)
Cardiology Office Note:    Date:  10/07/2019   ID:  Claudia Dawson, DOB 05-01-1960, MRN [id number]  PCP:  Doreene Nest, NP  [facility] Cardiologist:  Debbe Odea, MD  [facility] Electrophysiologist:  None   Referring MD: Doreene Nest, NP   Chief Complaint  Patient presents with  . other    Follow up Echo &  Zio monitor. Meds reviewed by the pt. verbally. Pt. c/o palpitations at times.     History of Present Illness:    Claudia Dawson is a 59 y.o. female with no significant past medical history who presents for follow-up.  She was last seen due to palpitations for over 2 months.  Symptoms were not related with exertion.  She noted occasional skipped heartbeats.  A cardiac monitor was placed to evaluate any significant arrhythmias. Patient still has symptoms of palpitations. Denies any chest pain or shortness of breath. Denies any history of heart disease.   Past Medical History:  Diagnosis Date  . Allergy   . Prediabetes     Past Surgical History:  Procedure Laterality Date  . BREAST CYST ASPIRATION Bilateral   . CESAREAN SECTION    . TUBAL LIGATION      Current Medications: Current Meds  Medication Sig  . b complex vitamins tablet Take 1 tablet by mouth daily.  Marland Kitchen CALCIUM CITRATE PO Take 1 tablet by mouth. three times a week  . Cholecalciferol (VITAMIN D3) 5000 units CAPS Take 1 capsule by mouth daily.  . Probiotic Product (PROBIOTIC DAILY PO) Take by mouth.     Allergies:   Patient has no known allergies.   Social History   Socioeconomic History  . Marital status: Widowed    Spouse name: Not on file  . Number of children: Not on file  . Years of education: Not on file  . Highest education level: Not on file  Occupational History  . Not on file  Tobacco Use  . Smoking status: Never Smoker  . Smokeless tobacco: Never Used  Vaping Use  . Vaping Use: Never used  Substance and Sexual Activity  . Alcohol use: No  .  Drug use: No  . Sexual activity: Not on file  Other Topics Concern  . Not on file  Social History Narrative   Lives in [place].    Husband died of MI at age 58 in Mar 06, 2012.    Daughters live nearby.   Work - Geologist, engineering for first grade.   Exercise - walks and lifts weights with group from church.   Social Determinants of Health   Financial Resource Strain:   . Difficulty of Paying Living Expenses: Not on file  Food Insecurity:   . Worried About Programme researcher, broadcasting/film/video in the Last Year: Not on file  . Ran Out of Food in the Last Year: Not on file  Transportation Needs:   . Lack of Transportation (Medical): Not on file  . Lack of Transportation (Non-Medical): Not on file  Physical Activity:   . Days of Exercise per Week: Not on file  . Minutes of Exercise per Session: Not on file  Stress:   . Feeling of Stress : Not on file  Social Connections:   . Frequency of Communication with Friends and Family: Not on file  . Frequency of Social Gatherings with Friends and Family: Not on file  . Attends Religious Services: Not on file  . Active Member of Clubs or Organizations: Not  on file  . Attends Banker Meetings: Not on file  . Marital Status: Not on file     Family History: The patient's family history includes Arrhythmia in her father; Atrial fibrillation in her father; Breast cancer in her paternal grandmother; Cancer in her brother, maternal aunt, and maternal grandmother; Heart disease in her father; Hypertension in her father; Stroke in her paternal grandmother; Ulcers in her father.  ROS:   Please see the history of present illness.     All other systems reviewed and are negative.  EKGs/Labs/Other Studies Reviewed:    The following studies were reviewed today:   EKG:  EKG not ordered today.  Recent Labs: 08/01/2019: ALT 19; BUN 10; Creatinine, Ser 0.72; Hemoglobin 12.4; Platelets 226.0; Potassium 4.4; Sodium 143; TSH 1.28  Recent Lipid Panel      Component Value Date/Time   CHOL 201 (H) 08/01/2019 1217   TRIG 71.0 08/01/2019 1217   HDL 76.70 08/01/2019 1217   CHOLHDL 3 08/01/2019 1217   VLDL 14.2 08/01/2019 1217   LDLCALC 110 (H) 08/01/2019 1217    Physical Exam:    VS:  BP 100/64 (BP Location: Left Arm, Patient Position: Sitting, Cuff Size: Normal)   Pulse 81   Ht 5\' 6"  (1.676 m)   Wt 151 lb 4 oz (68.6 kg)   SpO2 98%   BMI 24.41 kg/m     Wt Readings from Last 3 Encounters:  10/07/19 151 lb 4 oz (68.6 kg)  08/23/19 147 lb (66.7 kg)  08/01/19 149 lb (67.6 kg)     GEN:  Well nourished, well developed in no acute distress HEENT: Normal NECK: No JVD; No carotid bruits LYMPHATICS: No lymphadenopathy CARDIAC: RRR, no murmurs, rubs, gallops RESPIRATORY:  Clear to auscultation without rales, wheezing or rhonchi  ABDOMEN: Soft, non-tender, non-distended MUSCULOSKELETAL:  No edema; No deformity  SKIN: Warm and dry NEUROLOGIC:  Alert and oriented x 3 PSYCHIATRIC:  Normal affect   ASSESSMENT:    1. Paroxysmal SVT (supraventricular tachycardia) (HCC)    PLAN:    In order of problems listed above:  1. Patient with history of palpitations.  Cardiac monitor showed occasional episodes of paroxysmal supraventricular tachycardias associated with patient triggered events.  Echocardiogram subsequently performed showed normal systolic and diastolic function, EF 65%. Patient's blood pressure is on the low normal end. Start Toprol-XL 12.5 mg daily to help with patient's symptoms. If patient's blood pressure becomes too low, and on her symptoms persist, consider EP referral for additional input.   Follow-up in 2 months  Total encounter time 40 minutes  Greater than 50% was spent in counseling and coordination of care with the patient I am spent indicating patient on etiology for SVT, findings on cardiac monitor, findings on echocardiogram. Patient had very insightful questions which were all answered.   This note was generated  in part or whole with voice recognition software. Voice recognition is usually quite accurate but there are transcription errors that can and very often do occur. I apologize for any typographical errors that were not detected and corrected.   Medication Adjustments/Labs and Tests Ordered: Current medicines are reviewed at length with the patient today.  Concerns regarding medicines are outlined above.  No orders of the defined types were placed in this encounter.  Meds ordered this encounter  Medications  . DISCONTD: metoprolol succinate (TOPROL XL) 25 MG 24 hr tablet    Sig: Take 0.5 tablets (12.5 mg total) by mouth daily.    Dispense:  30 tablet    Refill:  3  . metoprolol succinate (TOPROL XL) 25 MG 24 hr tablet    Sig: Take 0.5 tablets (12.5 mg total) by mouth daily.    Dispense:  30 tablet    Refill:  3    Patient Instructions  Medication Instructions:   Your physician has recommended you make the following change in your medication:   1.  START taking metoprolol succinate (TOPROL XL) 25 MG 24 hr tablet:  Take 0.5 tablets (12.5 mg total) by mouth daily.  *If you need a refill on your cardiac medications before your next appointment, please call your pharmacy*   Lab Work: None Ordered If you have labs (blood work) drawn today and your tests are completely normal, you will receive your results only by: Marland Kitchen MyChart Message (if you have MyChart) OR . A paper copy in the mail If you have any lab test that is abnormal or we need to change your treatment, we will call you to review the results.   Testing/Procedures: None Ordered   Follow-Up: At [facility], you and your health needs are our priority.  As part of our continuing mission to provide you with exceptional heart care, we have created designated Provider Care Teams.  These Care Teams include your primary Cardiologist (physician) and Advanced Practice Providers (APPs -  Physician Assistants and Nurse Practitioners)  who all work together to provide you with the care you need, when you need it.  We recommend signing up for the patient portal called "MyChart".  Sign up information is provided on this After Visit Summary.  MyChart is used to connect with patients for Virtual Visits (Telemedicine).  Patients are able to view lab/test results, encounter notes, upcoming appointments, etc.  Non-urgent messages can be sent to your provider as well.   To learn more about what you can do with MyChart, go to [email or web address].    Your next appointment:   2 month(s)  The format for your next appointment:   In Person  Provider:   Debbe Odea, MD   Other Instructions     Signed, Debbe Odea, MD  10/07/2019 5:18 PM    [facility]

## 2019-10-08 ENCOUNTER — Telehealth: Payer: Self-pay | Admitting: Cardiology

## 2019-10-08 NOTE — Telephone Encounter (Signed)
Attempted to schedule 2 m fu per checkout 8/23 [facility]

## 2019-11-04 ENCOUNTER — Encounter: Payer: BC Managed Care – PPO | Admitting: Primary Care

## 2019-11-07 ENCOUNTER — Other Ambulatory Visit (HOSPITAL_COMMUNITY)
Admission: RE | Admit: 2019-11-07 | Discharge: 2019-11-07 | Disposition: A | Discharge status: Home / Self Care | Payer: BC Managed Care – PPO | Source: Ambulatory Visit | Attending: Primary Care | Admitting: Primary Care

## 2019-11-07 ENCOUNTER — Encounter: Payer: Self-pay | Admitting: Primary Care

## 2019-11-07 ENCOUNTER — Other Ambulatory Visit: Payer: Self-pay

## 2019-11-07 ENCOUNTER — Ambulatory Visit (INDEPENDENT_AMBULATORY_CARE_PROVIDER_SITE_OTHER): Payer: BC Managed Care – PPO | Admitting: Primary Care

## 2019-11-07 VITALS — BP 110/62 | HR 67 | Temp 97.6°F | Ht 66.0 in | Wt 148.0 lb

## 2019-11-07 DIAGNOSIS — Z124 Encounter for screening for malignant neoplasm of cervix: Secondary | ICD-10-CM

## 2019-11-07 DIAGNOSIS — I471 Supraventricular tachycardia: Secondary | ICD-10-CM | POA: Diagnosis not present

## 2019-11-07 DIAGNOSIS — R7303 Prediabetes: Secondary | ICD-10-CM | POA: Diagnosis not present

## 2019-11-07 DIAGNOSIS — Z0000 Encounter for general adult medical examination without abnormal findings: Secondary | ICD-10-CM

## 2019-11-07 DIAGNOSIS — G43909 Migraine, unspecified, not intractable, without status migrainosus: Secondary | ICD-10-CM | POA: Diagnosis not present

## 2019-11-07 NOTE — Assessment & Plan Note (Signed)
Overall stable over the years. Continue to monitor.  Discussed the importance of a healthy diet and regular exercise in order for weight loss, and to reduce the risk of any potential medical problems.

## 2019-11-07 NOTE — Assessment & Plan Note (Signed)
Immunizations UTD.  Pap smear due, completed today. Mammogram UTD. Colonoscopy UTD, due in 2022.  Encouraged a healthy diet and exercise.  Exam today unremarkable. Labs reviewed from June 2021.

## 2019-11-07 NOTE — Assessment & Plan Note (Signed)
Following with cardiology, now on metoprolol succinate 12.5 mg. Improved.

## 2019-11-07 NOTE — Patient Instructions (Addendum)
Start exercising. You should be getting 150 minutes of moderate intensity exercise weekly.  Continue to work on a healthy diet. Ensure you are consuming 64 ounces of water daily.  We will be in touch with your pap smear results once received.  It was a pleasure to see you today!   Preventive Care 16-60 Years Old, Female Preventive care refers to visits with your health care provider and lifestyle choices that can promote health and wellness. This includes:  A yearly physical exam. This may also be called an annual well check.  Regular dental visits and eye exams.  Immunizations.  Screening for certain conditions.  Healthy lifestyle choices, such as eating a healthy diet, getting regular exercise, not using drugs or products that contain nicotine and tobacco, and limiting alcohol use. What can I expect for my preventive care visit? Physical exam Your health care provider will check your:  Height and weight. This may be used to calculate body mass index (BMI), which tells if you are at a healthy weight.  Heart rate and blood pressure.  Skin for abnormal spots. Counseling Your health care provider may ask you questions about your:  Alcohol, tobacco, and drug use.  Emotional well-being.  Home and relationship well-being.  Sexual activity.  Eating habits.  Work and work Statistician.  Method of birth control.  Menstrual cycle.  Pregnancy history. What immunizations do I need?  Influenza (flu) vaccine  This is recommended every year. Tetanus, diphtheria, and pertussis (Tdap) vaccine  You may need a Td booster every 10 years. Varicella (chickenpox) vaccine  You may need this if you have not been vaccinated. Zoster (shingles) vaccine  You may need this after age 41. Measles, mumps, and rubella (MMR) vaccine  You may need at least one dose of MMR if you were born in 1957 or later. You may also need a second dose. Pneumococcal conjugate (PCV13) vaccine  You  may need this if you have certain conditions and were not previously vaccinated. Pneumococcal polysaccharide (PPSV23) vaccine  You may need one or two doses if you smoke cigarettes or if you have certain conditions. Meningococcal conjugate (MenACWY) vaccine  You may need this if you have certain conditions. Hepatitis A vaccine  You may need this if you have certain conditions or if you travel or work in places where you may be exposed to hepatitis A. Hepatitis B vaccine  You may need this if you have certain conditions or if you travel or work in places where you may be exposed to hepatitis B. Haemophilus influenzae type b (Hib) vaccine  You may need this if you have certain conditions. Human papillomavirus (HPV) vaccine  If recommended by your health care provider, you may need three doses over 6 months. You may receive vaccines as individual doses or as more than one vaccine together in one shot (combination vaccines). Talk with your health care provider about the risks and benefits of combination vaccines. What tests do I need? Blood tests  Lipid and cholesterol levels. These may be checked every 5 years, or more frequently if you are over 61 years old.  Hepatitis C test.  Hepatitis B test. Screening  Lung cancer screening. You may have this screening every year starting at age 6 if you have a 30-pack-year history of smoking and currently smoke or have quit within the past 15 years.  Colorectal cancer screening. All adults should have this screening starting at age 41 and continuing until age 54. Your health care provider  may recommend screening at age 63 if you are at increased risk. You will have tests every 1-10 years, depending on your results and the type of screening test.  Diabetes screening. This is done by checking your blood sugar (glucose) after you have not eaten for a while (fasting). You may have this done every 1-3 years.  Mammogram. This may be done every 1-2  years. Talk with your health care provider about when you should start having regular mammograms. This may depend on whether you have a family history of breast cancer.  BRCA-related cancer screening. This may be done if you have a family history of breast, ovarian, tubal, or peritoneal cancers.  Pelvic exam and Pap test. This may be done every 3 years starting at age 76. Starting at age 4, this may be done every 5 years if you have a Pap test in combination with an HPV test. Other tests  Sexually transmitted disease (STD) testing.  Bone density scan. This is done to screen for osteoporosis. You may have this scan if you are at high risk for osteoporosis. Follow these instructions at home: Eating and drinking  Eat a diet that includes fresh fruits and vegetables, whole grains, lean protein, and low-fat dairy.  Take vitamin and mineral supplements as recommended by your health care provider.  Do not drink alcohol if: ? Your health care provider tells you not to drink. ? You are pregnant, may be pregnant, or are planning to become pregnant.  If you drink alcohol: ? Limit how much you have to 0-1 drink a day. ? Be aware of how much alcohol is in your drink. In the [place] one drink equals one 12 oz bottle of beer (355 mL), one 5 oz glass of wine (148 mL), or one 1 oz glass of hard liquor (44 mL). Lifestyle  Take daily care of your teeth and gums.  Stay active. Exercise for at least 30 minutes on 5 or more days each week.  Do not use any products that contain nicotine or tobacco, such as cigarettes, e-cigarettes, and chewing tobacco. If you need help quitting, ask your health care provider.  If you are sexually active, practice safe sex. Use a condom or other form of birth control (contraception) in order to prevent pregnancy and STIs (sexually transmitted infections).  If told by your health care provider, take low-dose aspirin daily starting at age 28. What's next?  Visit your  health care provider once a year for a well check visit.  Ask your health care provider how often you should have your eyes and teeth checked.  Stay up to date on all vaccines. This information is not intended to replace advice given to you by your health care provider. Make sure you discuss any questions you have with your health care provider. Document Revised: 10/12/2017 Document Reviewed: 10/12/2017 Elsevier Patient Education  2020 [employer].

## 2019-11-07 NOTE — Progress Notes (Signed)
Subjective:    Patient ID: Claudia Dawson, female    DOB: 06/03/1960, 59 y.o.   MRN: [id number]  HPI  This visit occurred during the SARS-CoV-2 public health emergency.  Safety protocols were in place, including screening questions prior to the visit, additional usage of staff PPE, and extensive cleaning of exam room while observing appropriate contact time as indicated for disinfecting solutions.   Claudia Dawson is a 59 year old female who presents today for complete physical.  Immunizations: -Tetanus: Completed in 2015 -Influenza: Due this season  -Shingles: Completed series -Covid-19: Completed series   Diet: She endorses a healthy diet.  Exercise: Active  Eye exam: Completed in 2021 Dental exam: Completes semi-annually   Pap Smear: Due today Mammogram: Completed in June 2021 Colonoscopy: Completed in 2012 Hep C Screen: Negative  BP Readings from Last 3 Encounters:  11/07/19 110/62  10/07/19 100/64  08/23/19 90/68      Review of Systems  Constitutional: Negative for unexpected weight change.  HENT: Negative for rhinorrhea.   Eyes: Negative for visual disturbance.  Respiratory: Negative for cough and shortness of breath.   Cardiovascular: Negative for chest pain.  Gastrointestinal: Negative for constipation and diarrhea.  Genitourinary: Negative for difficulty urinating.  Musculoskeletal: Negative for arthralgias and myalgias.  Skin: Negative for rash.  Allergic/Immunologic: Negative for environmental allergies.  Neurological: Negative for dizziness and headaches.  Psychiatric/Behavioral: The patient is not nervous/anxious.        Past Medical History:  Diagnosis Date   Allergy    Prediabetes      Social History   Socioeconomic History   Marital status: Widowed    Spouse name: Not on file   Number of children: Not on file   Years of education: Not on file   Highest education level: Not on file  Occupational History   Not on file   Tobacco Use   Smoking status: Never Smoker   Smokeless tobacco: Never Used  Vaping Use   Vaping Use: Never used  Substance and Sexual Activity   Alcohol use: No   Drug use: No   Sexual activity: Not on file  Other Topics Concern   Not on file  Social History Narrative   Lives in [place].    Husband died of MI at age 66 in 03/04/2012.    Daughters live nearby.   Work - Geologist, engineering for first grade.   Exercise - walks and lifts weights with group from church.   Social Determinants of Health   Financial Resource Strain:    Difficulty of Paying Living Expenses: Not on file  Food Insecurity:    Worried About Programme researcher, broadcasting/film/video in the Last Year: Not on file   [employer] of Food in the Last Year: Not on file  Transportation Needs:    Lack of Transportation (Medical): Not on file   Lack of Transportation (Non-Medical): Not on file  Physical Activity:    Days of Exercise per Week: Not on file   Minutes of Exercise per Session: Not on file  Stress:    Feeling of Stress : Not on file  Social Connections:    Frequency of Communication with Friends and Family: Not on file   Frequency of Social Gatherings with Friends and Family: Not on file   Attends Religious Services: Not on file   Active Member of Clubs or Organizations: Not on file   Attends Banker Meetings: Not on file   Marital  Status: Not on file  Intimate Partner Violence:    Fear of Current or Ex-Partner: Not on file   Emotionally Abused: Not on file   Physically Abused: Not on file   Sexually Abused: Not on file    Past Surgical History:  Procedure Laterality Date   BREAST CYST ASPIRATION Bilateral    CESAREAN SECTION     TUBAL LIGATION      Family History  Problem Relation Age of Onset   Hypertension Father    Atrial fibrillation Father    Heart disease Father        afib   Ulcers Father    Arrhythmia Father    Cancer Maternal Grandmother         Endometrial   Stroke Paternal Grandmother    Breast cancer Paternal Grandmother    Cancer Brother        kidney, unsure if malignant tumor   Cancer Maternal Aunt        half-aunt breast cancer    No Known Allergies  Current Outpatient Medications on File Prior to Visit  Medication Sig Dispense Refill   b complex vitamins tablet Take 1 tablet by mouth daily.     CALCIUM CITRATE PO Take 1 tablet by mouth. three times a week     Cholecalciferol (VITAMIN D3) 5000 units CAPS Take 1 capsule by mouth daily.     metoprolol succinate (TOPROL XL) 25 MG 24 hr tablet Take 0.5 tablets (12.5 mg total) by mouth daily. 30 tablet 3   Probiotic Product (PROBIOTIC DAILY PO) Take by mouth.     No current facility-administered medications on file prior to visit.    BP 110/62    Pulse 67    Temp 97.6 F (36.4 C) (Temporal)    Ht 5\' 6"  (1.676 m)    Wt 148 lb (67.1 kg)    SpO2 99%    BMI 23.89 kg/m    Objective:   Physical Exam HENT:     Right Ear: Tympanic membrane and ear canal normal.     Left Ear: Tympanic membrane and ear canal normal.  Eyes:     Pupils: Pupils are equal, round, and reactive to light.  Cardiovascular:     Rate and Rhythm: Normal rate and regular rhythm.  Pulmonary:     Effort: Pulmonary effort is normal.     Breath sounds: Normal breath sounds.  Abdominal:     General: Bowel sounds are normal.     Palpations: Abdomen is soft.     Tenderness: There is no abdominal tenderness.  Musculoskeletal:        General: Normal range of motion.     Cervical back: Neck supple.  Skin:    General: Skin is warm and dry.  Neurological:     Mental Status: She is alert and oriented to person, place, and time.     Cranial Nerves: No cranial nerve deficit.     Deep Tendon Reflexes:     Reflex Scores:      Patellar reflexes are 2+ on the right side and 2+ on the left side. Psychiatric:        Mood and Affect: Mood normal.            Assessment & Plan:

## 2019-11-07 NOTE — Assessment & Plan Note (Signed)
No recent migraine/headache.

## 2019-11-11 LAB — CYTOLOGY - PAP
Comment: NEGATIVE
Diagnosis: UNDETERMINED — AB
High risk HPV: NEGATIVE

## 2019-12-16 ENCOUNTER — Other Ambulatory Visit: Payer: Self-pay

## 2019-12-16 ENCOUNTER — Encounter: Payer: Self-pay | Admitting: Cardiology

## 2019-12-16 ENCOUNTER — Ambulatory Visit: Payer: BC Managed Care – PPO | Admitting: Cardiology

## 2019-12-16 VITALS — BP 100/62 | HR 70 | Ht 66.0 in | Wt 148.4 lb

## 2019-12-16 DIAGNOSIS — I471 Supraventricular tachycardia: Secondary | ICD-10-CM

## 2019-12-16 NOTE — Progress Notes (Signed)
Cardiology Office Note:    Date:  12/16/2019   ID:  Claudia Dawson, DOB 10/10/1960, MRN [id number]  PCP:  Doreene Nest, NP  [facility] Cardiologist:  Debbe Odea, MD  [facility] Electrophysiologist:  None   Referring MD: Doreene Nest, NP   Chief Complaint  Patient presents with  . OTHER    2 month f/u no complaints today. Meds reviewed verbally with pt.    History of Present Illness:    Claudia Dawson is a 59 y.o. female with history of paroxysmal SVT who presents for follow-up.  She was previously seen due to palpitations, cardiac monitor revealed paroxysmal SVTs.  She was started on Toprol-XL 12.5 mg daily.  Echocardiogram on 09/2019 showed normal systolic and diastolic function.  Her symptoms have improved since starting Toprol-XL.  Her blood pressures have stayed roughly the same which is low normal at baseline.  Has no new complaints or concerns at this time.    Past Medical History:  Diagnosis Date  . Allergy   . Prediabetes     Past Surgical History:  Procedure Laterality Date  . BREAST CYST ASPIRATION Bilateral   . CESAREAN SECTION    . TUBAL LIGATION      Current Medications: Current Meds  Medication Sig  . b complex vitamins tablet Take 1 tablet by mouth daily.  Marland Kitchen CALCIUM CITRATE PO Take 1 tablet by mouth. three times a week  . Cholecalciferol (VITAMIN D) 50 MCG (2000 UT) CAPS Take by mouth daily.  . metoprolol succinate (TOPROL XL) 25 MG 24 hr tablet Take 0.5 tablets (12.5 mg total) by mouth daily.  . Probiotic Product (PROBIOTIC DAILY PO) Take by mouth every other day.      Allergies:   Patient has no known allergies.   Social History   Socioeconomic History  . Marital status: Widowed    Spouse name: Not on file  . Number of children: Not on file  . Years of education: Not on file  . Highest education level: Not on file  Occupational History  . Not on file  Tobacco Use  . Smoking status: Never Smoker    . Smokeless tobacco: Never Used  Vaping Use  . Vaping Use: Never used  Substance and Sexual Activity  . Alcohol use: No  . Drug use: No  . Sexual activity: Not on file  Other Topics Concern  . Not on file  Social History Narrative   Lives in [place].    Husband died of MI at age 63 in March 24, 2012.    Daughters live nearby.   Work - Geologist, engineering for first grade.   Exercise - walks and lifts weights with group from church.   Social Determinants of Health   Financial Resource Strain:   . Difficulty of Paying Living Expenses: Not on file  Food Insecurity:   . Worried About Programme researcher, broadcasting/film/video in the Last Year: Not on file  . Ran Out of Food in the Last Year: Not on file  Transportation Needs:   . Lack of Transportation (Medical): Not on file  . Lack of Transportation (Non-Medical): Not on file  Physical Activity:   . Days of Exercise per Week: Not on file  . Minutes of Exercise per Session: Not on file  Stress:   . Feeling of Stress : Not on file  Social Connections:   . Frequency of Communication with Friends and Family: Not on file  . Frequency of  Social Gatherings with Friends and Family: Not on file  . Attends Religious Services: Not on file  . Active Member of Clubs or Organizations: Not on file  . Attends Banker Meetings: Not on file  . Marital Status: Not on file     Family History: The patient's family history includes Arrhythmia in her father; Atrial fibrillation in her father; Breast cancer in her paternal grandmother; Cancer in her brother, maternal aunt, and maternal grandmother; Heart disease in her father; Hypertension in her father; Stroke in her paternal grandmother; Ulcers in her father.  ROS:   Please see the history of present illness.     All other systems reviewed and are negative.  EKGs/Labs/Other Studies Reviewed:    The following studies were reviewed today:   EKG:  EKG not ordered today.  Recent Labs: 08/01/2019: ALT  19; BUN 10; Creatinine, Ser 0.72; Hemoglobin 12.4; Platelets 226.0; Potassium 4.4; Sodium 143; TSH 1.28  Recent Lipid Panel    Component Value Date/Time   CHOL 201 (H) 08/01/2019 1217   TRIG 71.0 08/01/2019 1217   HDL 76.70 08/01/2019 1217   CHOLHDL 3 08/01/2019 1217   VLDL 14.2 08/01/2019 1217   LDLCALC 110 (H) 08/01/2019 1217    Physical Exam:    VS:  BP 100/62 (BP Location: Left Arm, Patient Position: Sitting, Cuff Size: Normal)   Pulse 70   Ht 5\' 6"  (1.676 m)   Wt 148 lb 6 oz (67.3 kg)   SpO2 98%   BMI 23.95 kg/m     Wt Readings from Last 3 Encounters:  12/16/19 148 lb 6 oz (67.3 kg)  11/07/19 148 lb (67.1 kg)  10/07/19 151 lb 4 oz (68.6 kg)     GEN:  Well nourished, well developed in no acute distress HEENT: Normal NECK: No JVD; No carotid bruits LYMPHATICS: No lymphadenopathy CARDIAC: RRR, no murmurs, rubs, gallops RESPIRATORY:  Clear to auscultation without rales, wheezing or rhonchi  ABDOMEN: Soft, non-tender, non-distended MUSCULOSKELETAL:  No edema; No deformity  SKIN: Warm and dry NEUROLOGIC:  Alert and oriented x 3 PSYCHIATRIC:  Normal affect   ASSESSMENT:    1. Paroxysmal SVT (supraventricular tachycardia) (HCC)    PLAN:    In order of problems listed above:  1. History of paroxysmal SVT, symptoms of palpitations improved since starting Toprol-XL.  Prior echocardiogram with normal systolic and diastolic function, EF 65%.  Continue Toprol-XL 12.5 mg daily as prescribed.   Follow-up in 12 months  Total encounter time 35 minutes  Greater than 50% was spent in counseling and coordination of care with the patient   This note was generated in part or whole with voice recognition software. Voice recognition is usually quite accurate but there are transcription errors that can and very often do occur. I apologize for any typographical errors that were not detected and corrected.   Medication Adjustments/Labs and Tests Ordered: Current medicines are  reviewed at length with the patient today.  Concerns regarding medicines are outlined above.  Orders Placed This Encounter  Procedures  . EKG 12-Lead   No orders of the defined types were placed in this encounter.   Patient Instructions  Medication Instructions:  Your physician recommends that you continue on your current medications as directed. Please refer to the Current Medication list given to you today. *If you need a refill on your cardiac medications before your next appointment, please call your pharmacy*   Lab Work: None Ordered If you have labs (blood work) drawn  today and your tests are completely normal, you will receive your results only by: Marland Kitchen MyChart Message (if you have MyChart) OR . A paper copy in the mail If you have any lab test that is abnormal or we need to change your treatment, we will call you to review the results.   Testing/Procedures: None ORdered   Follow-Up: At [facility], you and your health needs are our priority.  As part of our continuing mission to provide you with exceptional heart care, we have created designated Provider Care Teams.  These Care Teams include your primary Cardiologist (physician) and Advanced Practice Providers (APPs -  Physician Assistants and Nurse Practitioners) who all work together to provide you with the care you need, when you need it.  We recommend signing up for the patient portal called "MyChart".  Sign up information is provided on this After Visit Summary.  MyChart is used to connect with patients for Virtual Visits (Telemedicine).  Patients are able to view lab/test results, encounter notes, upcoming appointments, etc.  Non-urgent messages can be sent to your provider as well.   To learn more about what you can do with MyChart, go to [email or web address].    Your next appointment:   1 year(s)  The format for your next appointment:   In Person  Provider:   Debbe Odea, MD   Other  Instructions      Signed, Debbe Odea, MD  12/16/2019 5:06 PM    [facility]

## 2019-12-16 NOTE — Patient Instructions (Signed)
Medication Instructions:  Your physician recommends that you continue on your current medications as directed. Please refer to the Current Medication list given to you today. *If you need a refill on your cardiac medications before your next appointment, please call your pharmacy*   Lab Work: None Ordered If you have labs (blood work) drawn today and your tests are completely normal, you will receive your results only by: Marland Kitchen MyChart Message (if you have MyChart) OR . A paper copy in the mail If you have any lab test that is abnormal or we need to change your treatment, we will call you to review the results.   Testing/Procedures: None ORdered   Follow-Up: At [facility], you and your health needs are our priority.  As part of our continuing mission to provide you with exceptional heart care, we have created designated Provider Care Teams.  These Care Teams include your primary Cardiologist (physician) and Advanced Practice Providers (APPs -  Physician Assistants and Nurse Practitioners) who all work together to provide you with the care you need, when you need it.  We recommend signing up for the patient portal called "MyChart".  Sign up information is provided on this After Visit Summary.  MyChart is used to connect with patients for Virtual Visits (Telemedicine).  Patients are able to view lab/test results, encounter notes, upcoming appointments, etc.  Non-urgent messages can be sent to your provider as well.   To learn more about what you can do with MyChart, go to [email or web address].    Your next appointment:   1 year(s)  The format for your next appointment:   In Person  Provider:   Debbe Odea, MD   Other Instructions

## 2020-06-15 ENCOUNTER — Encounter: Payer: Self-pay | Admitting: Primary Care

## 2020-06-22 ENCOUNTER — Other Ambulatory Visit: Payer: Self-pay

## 2020-06-22 MED ORDER — METOPROLOL SUCCINATE ER 25 MG PO TB24: 12.5000 mg | ORAL_TABLET | Freq: Every day | ORAL | 3 refills | Status: DC

## 2020-07-27 ENCOUNTER — Other Ambulatory Visit: Payer: Self-pay | Admitting: Primary Care

## 2020-07-27 DIAGNOSIS — Z1231 Encounter for screening mammogram for malignant neoplasm of breast: Secondary | ICD-10-CM

## 2020-08-04 ENCOUNTER — Other Ambulatory Visit: Payer: Self-pay

## 2020-08-04 ENCOUNTER — Ambulatory Visit
Admission: RE | Admit: 2020-08-04 | Discharge: 2020-08-04 | Disposition: A | Discharge status: Home / Self Care | Payer: BC Managed Care – PPO | Source: Ambulatory Visit | Attending: Primary Care | Admitting: Primary Care

## 2020-08-04 DIAGNOSIS — Z1231 Encounter for screening mammogram for malignant neoplasm of breast: Secondary | ICD-10-CM | POA: Diagnosis not present

## 2020-09-22 ENCOUNTER — Other Ambulatory Visit: Payer: Self-pay

## 2020-09-22 MED ORDER — METOPROLOL SUCCINATE ER 25 MG PO TB24: 12.5000 mg | ORAL_TABLET | Freq: Every day | ORAL | 2 refills | Status: DC

## 2020-11-18 ENCOUNTER — Other Ambulatory Visit: Payer: Self-pay

## 2020-11-18 ENCOUNTER — Other Ambulatory Visit (HOSPITAL_COMMUNITY)
Admission: RE | Admit: 2020-11-18 | Discharge: 2020-11-18 | Disposition: A | Discharge status: Home / Self Care | Payer: BC Managed Care – PPO | Source: Ambulatory Visit | Attending: Primary Care | Admitting: Primary Care

## 2020-11-18 ENCOUNTER — Encounter: Payer: Self-pay | Admitting: Primary Care

## 2020-11-18 ENCOUNTER — Ambulatory Visit (INDEPENDENT_AMBULATORY_CARE_PROVIDER_SITE_OTHER): Payer: BC Managed Care – PPO | Admitting: Primary Care

## 2020-11-18 VITALS — BP 108/58 | HR 61 | Temp 97.6°F | Ht 66.0 in | Wt 157.0 lb

## 2020-11-18 DIAGNOSIS — Z124 Encounter for screening for malignant neoplasm of cervix: Secondary | ICD-10-CM | POA: Insufficient documentation

## 2020-11-18 DIAGNOSIS — I471 Supraventricular tachycardia, unspecified: Secondary | ICD-10-CM

## 2020-11-18 DIAGNOSIS — Z8616 Personal history of COVID-19: Secondary | ICD-10-CM

## 2020-11-18 DIAGNOSIS — Z23 Encounter for immunization: Secondary | ICD-10-CM

## 2020-11-18 DIAGNOSIS — R7303 Prediabetes: Secondary | ICD-10-CM | POA: Diagnosis not present

## 2020-11-18 DIAGNOSIS — Z0000 Encounter for general adult medical examination without abnormal findings: Secondary | ICD-10-CM | POA: Diagnosis not present

## 2020-11-18 DIAGNOSIS — Z1211 Encounter for screening for malignant neoplasm of colon: Secondary | ICD-10-CM | POA: Diagnosis not present

## 2020-11-18 DIAGNOSIS — G43709 Chronic migraine without aura, not intractable, without status migrainosus: Secondary | ICD-10-CM | POA: Diagnosis not present

## 2020-11-18 LAB — LIPID PANEL
Cholesterol: 216 mg/dL — ABNORMAL HIGH (ref 0–200)
HDL: 72.4 mg/dL (ref >39.00)
LDL Cholesterol: 125 mg/dL — ABNORMAL HIGH (ref 0–99)
NonHDL: 143.34
Total CHOL/HDL Ratio: 3
Triglycerides: 94 mg/dL (ref 0.0–149.0)
VLDL: 18.8 mg/dL (ref 0.0–40.0)

## 2020-11-18 LAB — CBC
HCT: 40.5 % (ref 36.0–46.0)
Hemoglobin: 13.5 g/dL (ref 12.0–15.0)
MCHC: 33.4 g/dL (ref 30.0–36.0)
MCV: 92.7 fl (ref 78.0–100.0)
Platelets: 205 10*3/uL (ref 150.0–400.0)
RBC: 4.37 Mil/uL (ref 3.87–5.11)
RDW: 13.6 % (ref 11.5–15.5)
WBC: 4.7 10*3/uL (ref 4.0–10.5)

## 2020-11-18 LAB — COMPREHENSIVE METABOLIC PANEL
ALT: 17 U/L (ref 0–35)
AST: 18 U/L (ref 0–37)
Albumin: 4.4 g/dL (ref 3.5–5.2)
Alkaline Phosphatase: 68 U/L (ref 39–117)
BUN: 14 mg/dL (ref 6–23)
CO2: 30 mEq/L (ref 19–32)
Calcium: 9.6 mg/dL (ref 8.4–10.5)
Chloride: 104 mEq/L (ref 96–112)
Creatinine, Ser: 0.7 mg/dL (ref 0.40–1.20)
GFR: 93.66 mL/min (ref >60.00)
Glucose, Bld: 90 mg/dL (ref 70–99)
Potassium: 4.7 mEq/L (ref 3.5–5.1)
Sodium: 141 mEq/L (ref 135–145)
Total Bilirubin: 0.6 mg/dL (ref 0.2–1.2)
Total Protein: 7 g/dL (ref 6.0–8.3)

## 2020-11-18 LAB — HEMOGLOBIN A1C: Hgb A1c MFr Bld: 6 % (ref 4.6–6.5)

## 2020-11-18 LAB — SARS-COV-2 IGG: SARS-COV-2 IgG: 7.27

## 2020-11-18 NOTE — Assessment & Plan Note (Signed)
Overall stable, no concerns today.  Continue to monitor.

## 2020-11-18 NOTE — Progress Notes (Signed)
Subjective:    Patient ID: Claudia Dawson, female    DOB: 1960-09-12, 60 y.o.   MRN: [id number]  HPI  Claudia Dawson is a very pleasant 60 y.o. female who presents today for complete physical and follow up of chronic conditions.  Immunizations: -Tetanus: 2015 -Influenza: Due -Covid-19: 2 vaccines -Shingles: Shingrix  Diet: Fair diet.  Exercise: No regular exercise.  Eye exam: Completes annually  Dental exam: Completes semi-annually   Pap Smear: Completed in 2021, due again today. Mammogram: Completed in June 2022 Colonoscopy: Completed in 2012, due   BP Readings from Last 3 Encounters:  11/18/20 (!) 108/58  12/16/19 100/62  11/07/19 110/62     Review of Systems  Constitutional:  Negative for unexpected weight change.  HENT:  Negative for rhinorrhea.   Eyes:  Negative for visual disturbance.  Respiratory:  Negative for shortness of breath.   Cardiovascular:  Positive for palpitations. Negative for chest pain.  Gastrointestinal:  Negative for constipation and diarrhea.  Genitourinary:  Negative for difficulty urinating.       Pelvic bulge  Musculoskeletal:  Negative for arthralgias and myalgias.  Skin:  Negative for rash.  Allergic/Immunologic: Positive for environmental allergies.  Neurological:  Positive for headaches. Negative for dizziness.  Psychiatric/Behavioral:  The patient is not nervous/anxious.         Past Medical History:  Diagnosis Date   Allergy    Prediabetes     Social History   Socioeconomic History   Marital status: Widowed    Spouse name: Not on file   Number of children: Not on file   Years of education: Not on file   Highest education level: Not on file  Occupational History   Not on file  Tobacco Use   Smoking status: Never   Smokeless tobacco: Never  Vaping Use   Vaping Use: Never used  Substance and Sexual Activity   Alcohol use: No   Drug use: No   Sexual activity: Not on file  Other Topics Concern    Not on file  Social History Narrative   Lives in [place].    Husband died of MI at age 4 in 02-26-12.    Daughters live nearby.   Work - Geologist, engineering for first grade.   Exercise - walks and lifts weights with group from church.   Social Determinants of Health   Financial Resource Strain: Not on file  Food Insecurity: Not on file  Transportation Needs: Not on file  Physical Activity: Not on file  Stress: Not on file  Social Connections: Not on file  Intimate Partner Violence: Not on file    Past Surgical History:  Procedure Laterality Date   BREAST CYST ASPIRATION Bilateral    CESAREAN SECTION     TUBAL LIGATION      Family History  Problem Relation Age of Onset   Hypertension Father    Atrial fibrillation Father    Heart disease Father        afib   Ulcers Father    Arrhythmia Father    Cancer Maternal Grandmother        Endometrial   Stroke Paternal Grandmother    Breast cancer Paternal Grandmother    Cancer Brother        kidney, unsure if malignant tumor   Cancer Maternal Aunt        half-aunt breast cancer    No Known Allergies  Current Outpatient Medications on File Prior to Visit  Medication Sig Dispense Refill   Ascorbic Acid (VITAMIN C) 1000 MG tablet Take 1,000 mg by mouth daily.     b complex vitamins tablet Take 1 tablet by mouth daily.     Cholecalciferol (VITAMIN D) 50 MCG (2000 UT) CAPS Take by mouth daily.     metoprolol succinate (TOPROL XL) 25 MG 24 hr tablet Take 0.5 tablets (12.5 mg total) by mouth daily. 15 tablet 2   Probiotic Product (PROBIOTIC DAILY PO) Take by mouth every other day.      Zinc Sulfate (ZINC-220 PO) Take by mouth.     CALCIUM CITRATE PO Take 1 tablet by mouth. three times a week (Patient not taking: Reported on 11/18/2020)     No current facility-administered medications on file prior to visit.    BP (!) 108/58   Pulse 61   Temp 97.6 F (36.4 C) (Temporal)   Ht 5\' 6"  (1.676 m)   Wt 157 lb (71.2 kg)    SpO2 100%   BMI 25.34 kg/m  Objective:   Physical Exam HENT:     Right Ear: Tympanic membrane and ear canal normal.     Left Ear: Tympanic membrane and ear canal normal.     Nose: Nose normal.  Eyes:     Conjunctiva/sclera: Conjunctivae normal.     Pupils: Pupils are equal, round, and reactive to light.  Neck:     Thyroid: No thyromegaly.  Cardiovascular:     Rate and Rhythm: Normal rate and regular rhythm.     Heart sounds: No murmur heard. Pulmonary:     Effort: Pulmonary effort is normal.     Breath sounds: Normal breath sounds. No rales.  Abdominal:     General: Bowel sounds are normal.     Palpations: Abdomen is soft.     Tenderness: There is no abdominal tenderness.  Genitourinary:    Labia:        Right: No tenderness or lesion.        Left: No tenderness or lesion.      Vagina: No vaginal discharge or erythema.     Cervix: No discharge, erythema or cervical bleeding.     Uterus: Not tender.      Adnexa: Right adnexa normal and left adnexa normal.  Musculoskeletal:        General: Normal range of motion.     Cervical back: Neck supple.  Lymphadenopathy:     Cervical: No cervical adenopathy.  Skin:    General: Skin is warm and dry.     Findings: No rash.  Neurological:     Mental Status: She is alert and oriented to person, place, and time.     Cranial Nerves: No cranial nerve deficit.     Deep Tendon Reflexes: Reflexes are normal and symmetric.  Psychiatric:        Mood and Affect: Mood normal.          Assessment & Plan:      This visit occurred during the SARS-CoV-2 public health emergency.  Safety protocols were in place, including screening questions prior to the visit, additional usage of staff PPE, and extensive cleaning of exam room while observing appropriate contact time as indicated for disinfecting solutions.

## 2020-11-18 NOTE — Patient Instructions (Addendum)
Stop by the lab prior to leaving today. I will notify you of your results once received.   You will be contacted regarding your referral to GI for the colonoscopy.  Please let us know if you have not been contacted within two weeks.   Please establish with a cardiologist as discussed.  It was a pleasure to see you today!  Preventive Care 30-61 Years Old, Female Preventive care refers to lifestyle choices and visits with your health care provider that can promote health and wellness. This includes: A yearly physical exam. This is also called an annual wellness visit. Regular dental and eye exams. Immunizations. Screening for certain conditions. Healthy lifestyle choices, such as: Eating a healthy diet. Getting regular exercise. Not using drugs or products that contain nicotine and tobacco. Limiting alcohol use. What can I expect for my preventive care visit? Physical exam Your health care provider will check your: Height and weight. These may be used to calculate your BMI (body mass index). BMI is a measurement that tells if you are at a healthy weight. Heart rate and blood pressure. Body temperature. Skin for abnormal spots. Counseling Your health care provider may ask you questions about your: Past medical problems. Family's medical history. Alcohol, tobacco, and drug use. Emotional well-being. Home life and relationship well-being. Sexual activity. Diet, exercise, and sleep habits. Work and work Statistician. Access to firearms. Method of birth control. Menstrual cycle. Pregnancy history. What immunizations do I need? Vaccines are usually given at various ages, according to a schedule. Your health care provider will recommend vaccines for you based on your age, medical history, and lifestyle or other factors, such as travel or where you work. What tests do I need? Blood tests Lipid and cholesterol levels. These may be checked every 5 years, or more often if you are over  79 years old. Hepatitis C test. Hepatitis B test. Screening Lung cancer screening. You may have this screening every year starting at age 43 if you have a 30-pack-year history of smoking and currently smoke or have quit within the past 15 years. Colorectal cancer screening. All adults should have this screening starting at age 76 and continuing until age 80. Your health care provider may recommend screening at age 24 if you are at increased risk. You will have tests every 1-10 years, depending on your results and the type of screening test. Diabetes screening. This is done by checking your blood sugar (glucose) after you have not eaten for a while (fasting). You may have this done every 1-3 years. Mammogram. This may be done every 1-2 years. Talk with your health care provider about when you should start having regular mammograms. This may depend on whether you have a family history of breast cancer. BRCA-related cancer screening. This may be done if you have a family history of breast, ovarian, tubal, or peritoneal cancers. Pelvic exam and Pap test. This may be done every 3 years starting at age 26. Starting at age 40, this may be done every 5 years if you have a Pap test in combination with an HPV test. Other tests STD (sexually transmitted disease) testing, if you are at risk. Bone density scan. This is done to screen for osteoporosis. You may have this scan if you are at high risk for osteoporosis. Talk with your health care provider about your test results, treatment options, and if necessary, the need for more tests. Follow these instructions at home: Eating and drinking  Eat a diet that includes fresh  fruits and vegetables, whole grains, lean protein, and low-fat dairy products. Take vitamin and mineral supplements as recommended by your health care provider. Do not drink alcohol if: Your health care provider tells you not to drink. You are pregnant, may be pregnant, or are  planning to become pregnant. If you drink alcohol: Limit how much you have to 0-1 drink a day. Be aware of how much alcohol is in your drink. In the [place] one drink equals one 12 oz bottle of beer (355 mL), one 5 oz glass of wine (148 mL), or one 1 oz glass of hard liquor (44 mL). Lifestyle Take daily care of your teeth and gums. Brush your teeth every morning and night with fluoride toothpaste. Floss one time each day. Stay active. Exercise for at least 30 minutes 5 or more days each week. Do not use any products that contain nicotine or tobacco, such as cigarettes, e-cigarettes, and chewing tobacco. If you need help quitting, ask your health care provider. Do not use drugs. If you are sexually active, practice safe sex. Use a condom or other form of protection to prevent STIs (sexually transmitted infections). If you do not wish to become pregnant, use a form of birth control. If you plan to become pregnant, see your health care provider for a prepregnancy visit. If told by your health care provider, take low-dose aspirin daily starting at age 37. Find healthy ways to cope with stress, such as: Meditation, yoga, or listening to music. Journaling. Talking to a trusted person. Spending time with friends and family. Safety Always wear your seat belt while driving or riding in a vehicle. Do not drive: If you have been drinking alcohol. Do not ride with someone who has been drinking. When you are tired or distracted. While texting. Wear a helmet and other protective equipment during sports activities. If you have firearms in your house, make sure you follow all gun safety procedures. What's next? Visit your health care provider once a year for an annual wellness visit. Ask your health care provider how often you should have your eyes and teeth checked. Stay up to date on all vaccines. This information is not intended to replace advice given to you by your health care provider. Make sure  you discuss any questions you have with your health care provider. Document Revised: 04/10/2020 Document Reviewed: 10/12/2017 Elsevier Patient Education  2022 [employer].

## 2020-11-18 NOTE — Assessment & Plan Note (Signed)
Discussed the importance of a healthy diet and regular exercise in order for weight loss, and to reduce the risk of further co-morbidity. ? ?Repeat A1C pending. ?

## 2020-11-18 NOTE — Assessment & Plan Note (Signed)
Evaluated by cardiology last year, doing well on metoprolol succinate 12.5 mg daily. Continue same.  Agree to provide refills when due. She will notify.

## 2020-11-18 NOTE — Assessment & Plan Note (Signed)
Influenza vaccine provided today. Other vaccines UTD. Repeat pap smear due, completed today.  Colonoscopy due, referral placed to GI. Mammogram UTD.  Discussed the importance of a healthy diet and regular exercise in order for weight loss, and to reduce the risk of further co-morbidity.  Exam today stable. Labs pending.

## 2020-11-20 LAB — CYTOLOGY - PAP
Comment: NEGATIVE
Diagnosis: NEGATIVE
High risk HPV: NEGATIVE

## 2020-12-21 ENCOUNTER — Other Ambulatory Visit: Payer: Self-pay

## 2020-12-21 MED ORDER — METOPROLOL SUCCINATE ER 25 MG PO TB24: 12.5000 mg | ORAL_TABLET | Freq: Every day | ORAL | 0 refills | Status: DC

## 2020-12-31 DIAGNOSIS — Z1211 Encounter for screening for malignant neoplasm of colon: Secondary | ICD-10-CM

## 2020-12-31 DIAGNOSIS — I471 Supraventricular tachycardia: Secondary | ICD-10-CM

## 2021-01-01 MED ORDER — METOPROLOL SUCCINATE ER 25 MG PO TB24: 12.5000 mg | ORAL_TABLET | Freq: Every day | ORAL | 2 refills | Status: DC

## 2021-01-21 ENCOUNTER — Telehealth: Payer: Self-pay | Admitting: Primary Care

## 2021-01-21 NOTE — Telephone Encounter (Signed)
Claudia Dawson with [facility] called stating that they haven't received the referral for a coloscopy for pt. Claudia Dawson states you can fax the referral to [phone]

## 2021-01-22 NOTE — Telephone Encounter (Signed)
This has been faxed.

## 2021-02-02 NOTE — Telephone Encounter (Signed)
Ashtyn, is this something you would do? I've never been asked for this information before.

## 2021-02-03 NOTE — Telephone Encounter (Signed)
This was faxed 12/9 but I will refax again  Referral/Order faxed to requested number Faxed to [phone]     [email or web address]   Maisie Fus, CMA on 01/22/2021 1547 - delivered at 01/22/2021 1547

## 2021-02-03 NOTE — Telephone Encounter (Addendum)
I am refaxing the information again.  This has been faxed three times now. If they still dont receive it, it may have to be printed and faxed, the electronic faxing may not be working 100%

## 2021-02-04 NOTE — Telephone Encounter (Signed)
Ashtyn or Joellen,  Can you send the office notes from her CPE with me in October 2022?  Mayra Reel, NP-C

## 2021-02-04 NOTE — Telephone Encounter (Signed)
Joellen, can you please print these and fax them to ensure they go through . I am off tomorrow and will not be able to follow up if these go through with my electronic faxing. Thanks!

## 2021-03-29 LAB — HM COLONOSCOPY

## 2021-04-19 ENCOUNTER — Encounter: Payer: Self-pay | Admitting: Primary Care

## 2021-06-25 ENCOUNTER — Telehealth: Payer: Self-pay | Admitting: Cardiology

## 2021-06-25 NOTE — Telephone Encounter (Signed)
Noted  

## 2021-06-25 NOTE — Telephone Encounter (Signed)
Patient declined to schedule  ?Deleted recall ?

## 2021-11-24 ENCOUNTER — Encounter: Payer: Self-pay | Admitting: Primary Care

## 2021-11-24 ENCOUNTER — Ambulatory Visit (INDEPENDENT_AMBULATORY_CARE_PROVIDER_SITE_OTHER): Payer: BC Managed Care – PPO | Admitting: Primary Care

## 2021-11-24 VITALS — BP 112/68 | HR 60 | Temp 97.7°F | Ht 66.0 in | Wt 159.0 lb

## 2021-11-24 DIAGNOSIS — G43009 Migraine without aura, not intractable, without status migrainosus: Secondary | ICD-10-CM

## 2021-11-24 DIAGNOSIS — J302 Other seasonal allergic rhinitis: Secondary | ICD-10-CM

## 2021-11-24 DIAGNOSIS — R7303 Prediabetes: Secondary | ICD-10-CM

## 2021-11-24 DIAGNOSIS — Z114 Encounter for screening for human immunodeficiency virus [HIV]: Secondary | ICD-10-CM

## 2021-11-24 DIAGNOSIS — I471 Supraventricular tachycardia, unspecified: Secondary | ICD-10-CM | POA: Diagnosis not present

## 2021-11-24 DIAGNOSIS — Z23 Encounter for immunization: Secondary | ICD-10-CM | POA: Diagnosis not present

## 2021-11-24 DIAGNOSIS — Z0000 Encounter for general adult medical examination without abnormal findings: Secondary | ICD-10-CM

## 2021-11-24 DIAGNOSIS — N8110 Cystocele, unspecified: Secondary | ICD-10-CM

## 2021-11-24 LAB — HEMOGLOBIN A1C: Hgb A1c MFr Bld: 6 % (ref 4.6–6.5)

## 2021-11-24 LAB — COMPREHENSIVE METABOLIC PANEL
ALT: 15 U/L (ref 0–35)
AST: 20 U/L (ref 0–37)
Albumin: 4.4 g/dL (ref 3.5–5.2)
Alkaline Phosphatase: 67 U/L (ref 39–117)
BUN: 12 mg/dL (ref 6–23)
CO2: 30 mEq/L (ref 19–32)
Calcium: 9.7 mg/dL (ref 8.4–10.5)
Chloride: 104 mEq/L (ref 96–112)
Creatinine, Ser: 0.78 mg/dL (ref 0.40–1.20)
GFR: 81.67 mL/min (ref >60.00)
Glucose, Bld: 89 mg/dL (ref 70–99)
Potassium: 4.6 mEq/L (ref 3.5–5.1)
Sodium: 139 mEq/L (ref 135–145)
Total Bilirubin: 0.5 mg/dL (ref 0.2–1.2)
Total Protein: 7.4 g/dL (ref 6.0–8.3)

## 2021-11-24 LAB — LIPID PANEL
Cholesterol: 201 mg/dL — ABNORMAL HIGH (ref 0–200)
HDL: 65.9 mg/dL (ref >39.00)
LDL Cholesterol: 114 mg/dL — ABNORMAL HIGH (ref 0–99)
NonHDL: 135.38
Total CHOL/HDL Ratio: 3
Triglycerides: 105 mg/dL (ref 0.0–149.0)
VLDL: 21 mg/dL (ref 0.0–40.0)

## 2021-11-24 NOTE — Assessment & Plan Note (Signed)
Following with cardiology.  Continue metoprolol succinate 12.5 mg daily.

## 2021-11-24 NOTE — Assessment & Plan Note (Signed)
Immunizations UTD. Influenza vaccine provided today. Pap smear UTD. Mammogram UTD, she would like to defer until 2024. Colonoscopy UTD, due 2033.  Discussed the importance of a healthy diet and regular exercise in order for weight loss, and to reduce the risk of further co-morbidity.  Exam stable. Labs pending.  Follow up in 1 year for repeat physical.

## 2021-11-24 NOTE — Progress Notes (Signed)
Subjective:    Patient ID: [name], female    DOB: [birth year], 61 y.o.   MRN: [id number]  HPI  Keishawna Carranza is a very pleasant 61 y.o. female who presents today for complete physical and follow up of chronic conditions.  Immunizations: -Tetanus: 2015 -Influenza: Due today -Shingles: Completed Shingrix  Diet: Fair diet. Lower carb and higher protein overall.  Exercise: Regular exercise a few days weekly at times.  Eye exam: Completes annually  Dental exam: Completes semi-annually   Pap Smear: Completed in 2022 Mammogram: Completed in June 2022, declines this year.  Colonoscopy: Completed in 2023, due 2033  BP Readings from Last 3 Encounters:  11/24/21 112/68  11/18/20 (!) 108/58  12/16/19 100/62    Wt Readings from Last 3 Encounters:  11/24/21 159 lb (72.1 kg)  11/18/20 157 lb (71.2 kg)  12/16/19 148 lb 6 oz (67.3 kg)       Review of Systems  Constitutional:  Negative for unexpected weight change.  HENT:  Negative for rhinorrhea.   Respiratory:  Negative for cough and shortness of breath.   Cardiovascular:  Negative for chest pain.  Gastrointestinal:  Negative for constipation and diarrhea.  Genitourinary:  Negative for difficulty urinating.       Vaginal prolapse   Musculoskeletal:  Positive for arthralgias.  Skin:  Negative for rash.  Allergic/Immunologic: Negative for environmental allergies.  Neurological:  Negative for dizziness and headaches.  Psychiatric/Behavioral:  The patient is not nervous/anxious.          Past Medical History:  Diagnosis Date   Allergy    Prediabetes     Social History   Socioeconomic History   Marital status: Widowed    Spouse name: Not on file   Number of children: Not on file   Years of education: Not on file   Highest education level: Not on file  Occupational History   Not on file  Tobacco Use   Smoking status: Never   Smokeless tobacco: Never  Vaping Use   Vaping Use: Never  used  Substance and Sexual Activity   Alcohol use: No   Drug use: No   Sexual activity: Not on file  Other Topics Concern   Not on file  Social History Narrative   Lives in [place].    Husband died of MI at age 5 in March 30, 2012.    Daughters live nearby.   Work - Geologist, engineering for first grade.   Exercise - walks and lifts weights with group from church.   Social Determinants of Health   Financial Resource Strain: Not on file  Food Insecurity: Not on file  Transportation Needs: Not on file  Physical Activity: Not on file  Stress: Not on file  Social Connections: Not on file  Intimate Partner Violence: Not on file    Past Surgical History:  Procedure Laterality Date   BREAST CYST ASPIRATION Bilateral    CESAREAN SECTION     TUBAL LIGATION      Family History  Problem Relation Age of Onset   Hypertension Father    Atrial fibrillation Father    Heart disease Father        afib   Ulcers Father    Arrhythmia Father    Cancer Maternal Grandmother        Endometrial   Stroke Paternal Grandmother    Breast cancer Paternal Grandmother    Cancer Brother        kidney, unsure if  malignant tumor   Cancer Maternal Aunt        half-aunt breast cancer    No Known Allergies  Current Outpatient Medications on File Prior to Visit  Medication Sig Dispense Refill   Ascorbic Acid (VITAMIN C) 1000 MG tablet Take 1,000 mg by mouth daily.     b complex vitamins tablet Take 1 tablet by mouth daily.     Cholecalciferol (VITAMIN D) 50 MCG (2000 UT) CAPS Take by mouth daily.     MAGNESIUM PO Take by mouth.     metoprolol succinate (TOPROL XL) 25 MG 24 hr tablet Take 0.5 tablets (12.5 mg total) by mouth daily. For heart rate. 90 tablet 2   Probiotic Product (PROBIOTIC DAILY PO) Take by mouth every other day.      Zinc Sulfate (ZINC-220 PO) Take by mouth.     CALCIUM CITRATE PO Take 1 tablet by mouth. three times a week (Patient not taking: Reported on 11/24/2021)     No  current facility-administered medications on file prior to visit.    BP 112/68   Pulse 60   Temp 97.7 F (36.5 C) (Temporal)   Ht 5\' 6"  (1.676 m)   Wt 159 lb (72.1 kg)   SpO2 100%   BMI 25.66 kg/m  Objective:   Physical Exam HENT:     Right Ear: Tympanic membrane and ear canal normal.     Left Ear: Tympanic membrane and ear canal normal.     Nose: Nose normal.  Eyes:     Conjunctiva/sclera: Conjunctivae normal.     Pupils: Pupils are equal, round, and reactive to light.  Neck:     Thyroid: No thyromegaly.  Cardiovascular:     Rate and Rhythm: Normal rate and regular rhythm.     Heart sounds: No murmur heard. Pulmonary:     Effort: Pulmonary effort is normal.     Breath sounds: Normal breath sounds. No rales.  Abdominal:     General: Bowel sounds are normal.     Palpations: Abdomen is soft.     Tenderness: There is no abdominal tenderness.  Musculoskeletal:        General: Normal range of motion.     Cervical back: Neck supple.  Lymphadenopathy:     Cervical: No cervical adenopathy.  Skin:    General: Skin is warm and dry.     Findings: No rash.  Neurological:     Mental Status: She is alert and oriented to person, place, and time.     Cranial Nerves: No cranial nerve deficit.     Deep Tendon Reflexes: Reflexes are normal and symmetric.  Psychiatric:        Mood and Affect: Mood normal.           Assessment & Plan:   Problem List Items Addressed This Visit       Cardiovascular and Mediastinum   Migraine    Infrequent migraines.   No concerns today. Continue to monitor.      SVT (supraventricular tachycardia)    Following with cardiology.  Continue metoprolol succinate 12.5 mg daily.        Genitourinary   Vaginal prolapse    Continued, bothersome. Referral placed to Uro/GYN.        Relevant Orders   Ambulatory referral to Urogynecology     Other   Seasonal allergies    Controlled.  No concerns today.       Routine general  medical examination at a health  care facility - Primary    Immunizations UTD. Influenza vaccine provided today. Pap smear UTD. Mammogram UTD, she would like to defer until 2024. Colonoscopy UTD, due 2033.  Discussed the importance of a healthy diet and regular exercise in order for weight loss, and to reduce the risk of further co-morbidity.  Exam stable. Labs pending.  Follow up in 1 year for repeat physical.       Prediabetes    Encouraged a healthy diet and regular exercise.   Repeat A1C pending.       Relevant Orders   Lipid panel   Hemoglobin A1c   Comprehensive metabolic panel   Other Visit Diagnoses     Screening for HIV (human immunodeficiency virus)       Relevant Orders   HIV Antibody (routine testing w rflx)          Pleas Koch, NP

## 2021-11-24 NOTE — Assessment & Plan Note (Signed)
Controlled. No concerns today. 

## 2021-11-24 NOTE — Patient Instructions (Signed)
Stop by the lab prior to leaving today. I will notify you of your results once received.   You will be contacted regarding your referral to Uro/GYN.  Please let us know if you have not been contacted within two weeks.   It was a pleasure to see you today!  Preventive Care 24-62 Years Old, Female Preventive care refers to lifestyle choices and visits with your health care provider that can promote health and wellness. Preventive care visits are also called wellness exams. What can I expect for my preventive care visit? Counseling Your health care provider may ask you questions about your: Medical history, including: Past medical problems. Family medical history. Pregnancy history. Current health, including: Menstrual cycle. Method of birth control. Emotional well-being. Home life and relationship well-being. Sexual activity and sexual health. Lifestyle, including: Alcohol, nicotine or tobacco, and drug use. Access to firearms. Diet, exercise, and sleep habits. Work and work Statistician. Sunscreen use. Safety issues such as seatbelt and bike helmet use. Physical exam Your health care provider will check your: Height and weight. These may be used to calculate your BMI (body mass index). BMI is a measurement that tells if you are at a healthy weight. Waist circumference. This measures the distance around your waistline. This measurement also tells if you are at a healthy weight and may help predict your risk of certain diseases, such as type 2 diabetes and high blood pressure. Heart rate and blood pressure. Body temperature. Skin for abnormal spots. What immunizations do I need?  Vaccines are usually given at various ages, according to a schedule. Your health care provider will recommend vaccines for you based on your age, medical history, and lifestyle or other factors, such as travel or where you work. What tests do I need? Screening Your health care provider may recommend  screening tests for certain conditions. This may include: Lipid and cholesterol levels. Diabetes screening. This is done by checking your blood sugar (glucose) after you have not eaten for a while (fasting). Pelvic exam and Pap test. Hepatitis B test. Hepatitis C test. HIV (human immunodeficiency virus) test. STI (sexually transmitted infection) testing, if you are at risk. Lung cancer screening. Colorectal cancer screening. Mammogram. Talk with your health care provider about when you should start having regular mammograms. This may depend on whether you have a family history of breast cancer. BRCA-related cancer screening. This may be done if you have a family history of breast, ovarian, tubal, or peritoneal cancers. Bone density scan. This is done to screen for osteoporosis. Talk with your health care provider about your test results, treatment options, and if necessary, the need for more tests. Follow these instructions at home: Eating and drinking  Eat a diet that includes fresh fruits and vegetables, whole grains, lean protein, and low-fat dairy products. Take vitamin and mineral supplements as recommended by your health care provider. Do not drink alcohol if: Your health care provider tells you not to drink. You are pregnant, may be pregnant, or are planning to become pregnant. If you drink alcohol: Limit how much you have to 0-1 drink a day. Know how much alcohol is in your drink. In the [place] one drink equals one 12 oz bottle of beer (355 mL), one 5 oz glass of wine (148 mL), or one 1 oz glass of hard liquor (44 mL). Lifestyle Brush your teeth every morning and night with fluoride toothpaste. Floss one time each day. Exercise for at least 30 minutes 5 or more days each week.  Do not use any products that contain nicotine or tobacco. These products include cigarettes, chewing tobacco, and vaping devices, such as e-cigarettes. If you need help quitting, ask your health care  provider. Do not use drugs. If you are sexually active, practice safe sex. Use a condom or other form of protection to prevent STIs. If you do not wish to become pregnant, use a form of birth control. If you plan to become pregnant, see your health care provider for a prepregnancy visit. Take aspirin only as told by your health care provider. Make sure that you understand how much to take and what form to take. Work with your health care provider to find out whether it is safe and beneficial for you to take aspirin daily. Find healthy ways to manage stress, such as: Meditation, yoga, or listening to music. Journaling. Talking to a trusted person. Spending time with friends and family. Minimize exposure to UV radiation to reduce your risk of skin cancer. Safety Always wear your seat belt while driving or riding in a vehicle. Do not drive: If you have been drinking alcohol. Do not ride with someone who has been drinking. When you are tired or distracted. While texting. If you have been using any mind-altering substances or drugs. Wear a helmet and other protective equipment during sports activities. If you have firearms in your house, make sure you follow all gun safety procedures. Seek help if you have been physically or sexually abused. What's next? Visit your health care provider once a year for an annual wellness visit. Ask your health care provider how often you should have your eyes and teeth checked. Stay up to date on all vaccines. This information is not intended to replace advice given to you by your health care provider. Make sure you discuss any questions you have with your health care provider. Document Revised: 07/29/2020 Document Reviewed: 07/29/2020 Elsevier Patient Education  2023 Elsevier Inc.  

## 2021-11-24 NOTE — Assessment & Plan Note (Signed)
Continued, bothersome. Referral placed to Uro/GYN.

## 2021-11-24 NOTE — Assessment & Plan Note (Signed)
Encouraged a healthy diet and regular exercise.   Repeat A1C pending.

## 2021-11-24 NOTE — Assessment & Plan Note (Signed)
Infrequent migraines.   No concerns today. Continue to monitor.

## 2021-11-25 LAB — HIV ANTIBODY (ROUTINE TESTING W REFLEX): HIV 1&2 Ab, 4th Generation: NONREACTIVE

## 2021-12-14 ENCOUNTER — Encounter: Payer: Self-pay | Admitting: Cardiology

## 2021-12-14 ENCOUNTER — Ambulatory Visit: Payer: BC Managed Care – PPO | Attending: Cardiology | Admitting: Cardiology

## 2021-12-14 VITALS — BP 108/62 | HR 60 | Ht 66.0 in | Wt 159.2 lb

## 2021-12-14 DIAGNOSIS — I471 Supraventricular tachycardia, unspecified: Secondary | ICD-10-CM | POA: Diagnosis not present

## 2021-12-14 MED ORDER — METOPROLOL SUCCINATE ER 25 MG PO TB24: 12.5000 mg | ORAL_TABLET | Freq: Every day | ORAL | 3 refills | Status: DC

## 2021-12-14 NOTE — Patient Instructions (Signed)
Medication Instructions:   Your physician recommends that you continue on your current medications as directed. Please refer to the Current Medication list given to you today.  *If you need a refill on your cardiac medications before your next appointment, please call your pharmacy*    Follow-Up: At [facility], you and your health needs are our priority.  As part of our continuing mission to provide you with exceptional heart care, we have created designated Provider Care Teams.  These Care Teams include your primary Cardiologist (physician) and Advanced Practice Providers (APPs -  Physician Assistants and Nurse Practitioners) who all work together to provide you with the care you need, when you need it.  We recommend signing up for the patient portal called "MyChart".  Sign up information is provided on this After Visit Summary.  MyChart is used to connect with patients for Virtual Visits (Telemedicine).  Patients are able to view lab/test results, encounter notes, upcoming appointments, etc.  Non-urgent messages can be sent to your provider as well.   To learn more about what you can do with MyChart, go to [email or web address].    Your next appointment:   Follow up as needede   The format for your next appointment:   In Person  Provider:   Kate Sable, MD    Other Instructions   Important Information About Sugar

## 2021-12-14 NOTE — Progress Notes (Signed)
Cardiology Office Note:    Date:  12/14/2021   ID:  Claudia Dawson, DOB May 20, 1960, MRN [id number]  PCP:  Doreene Nest, NP  [facility] Cardiologist:  Debbe Odea, MD  [facility] Electrophysiologist:  None   Referring MD: Doreene Nest, NP   Chief Complaint  Patient presents with   Follow-up    Annual f/u, last visit 12-16-19, no new cardiac concerns     History of Present Illness:    Claudia Dawson is a 61 y.o. female with history of paroxysmal SVT who presents for follow-up.    She takes Toprol-XL 12.5 mg daily as prescribed, rarely has palpitations, usually when she is stressed.  Denies any dizziness or syncope.  Tolerating medication as prescribed.  Feels well, has no concerns at this time.  Prior notes/studies Echo 09/2019 EF 65 to 70%. Cardiac monitor 09/2019, occasional paroxysmal SVT,   Past Medical History:  Diagnosis Date   Allergy    Prediabetes     Past Surgical History:  Procedure Laterality Date   BREAST CYST ASPIRATION Bilateral    CESAREAN SECTION     TUBAL LIGATION      Current Medications: No outpatient medications have been marked as taking for the 12/14/21 encounter (Office Visit) with Debbe Odea, MD.     Allergies:   Patient has no known allergies.   Social History   Socioeconomic History   Marital status: Widowed    Spouse name: Not on file   Number of children: Not on file   Years of education: Not on file   Highest education level: Not on file  Occupational History   Not on file  Tobacco Use   Smoking status: Never   Smokeless tobacco: Never  Vaping Use   Vaping Use: Never used  Substance and Sexual Activity   Alcohol use: No   Drug use: No   Sexual activity: Not on file  Other Topics Concern   Not on file  Social History Narrative   Lives in [place].    Husband died of MI at age 50 in 2012/03/10.    Daughters live nearby.   Work - Geologist, engineering for first grade.    Exercise - walks and lifts weights with group from church.   Social Determinants of Health   Financial Resource Strain: Not on file  Food Insecurity: Not on file  Transportation Needs: Not on file  Physical Activity: Not on file  Stress: Not on file  Social Connections: Not on file     Family History: The patient's family history includes Arrhythmia in her father; Atrial fibrillation in her father; Breast cancer in her paternal grandmother; Cancer in her brother, maternal aunt, and maternal grandmother; Heart disease in her father; Hypertension in her father; Stroke in her paternal grandmother; Ulcers in her father.  ROS:   Please see the history of present illness.     All other systems reviewed and are negative.  EKGs/Labs/Other Studies Reviewed:    The following studies were reviewed today:   EKG:  EKG is ordered today.  Normal sinus rhythm, heart rate 60, possible left atrial enlargement  Recent Labs: 11/24/2021: ALT 15; BUN 12; Creatinine, Ser 0.78; Potassium 4.6; Sodium 139  Recent Lipid Panel    Component Value Date/Time   CHOL 201 (H) 11/24/2021 0828   TRIG 105.0 11/24/2021 0828   HDL 65.90 11/24/2021 0828   CHOLHDL 3 11/24/2021 0828   VLDL 21.0 11/24/2021 0828   LDLCALC  114 (H) 11/24/2021 [id number]    Physical Exam:    VS:  BP 108/62 (BP Location: Left Arm, Patient Position: Sitting, Cuff Size: Normal)   Pulse 60   Ht 5\' 6"  (1.676 m)   Wt 159 lb 3.2 oz (72.2 kg)   SpO2 100%   BMI 25.70 kg/m     Wt Readings from Last 3 Encounters:  12/14/21 159 lb 3.2 oz (72.2 kg)  11/24/21 159 lb (72.1 kg)  11/18/20 157 lb (71.2 kg)     GEN:  Well nourished, well developed in no acute distress HEENT: Normal NECK: No JVD; No carotid bruits CARDIAC: RRR, no murmurs, rubs, gallops RESPIRATORY:  Clear to auscultation without rales, wheezing or rhonchi  ABDOMEN: Soft, non-tender, non-distended MUSCULOSKELETAL:  No edema; No deformity  SKIN: Warm and dry NEUROLOGIC:   Alert and oriented x 3 PSYCHIATRIC:  Normal affect   ASSESSMENT:    1. Paroxysmal SVT (supraventricular tachycardia)   2. SVT (supraventricular tachycardia)    PLAN:    In order of problems listed above:  Paroxysmal SVT, symptoms adequately controlled on Toprol-XL.  Continue Toprol-XL 25 mg daily.  Echo showed normal EF 65 to [id number], diastolic function normal.  Follow-up as needed.  Total encounter time 35 minutes  Greater than 50% was spent in counseling and coordination of care with the patient   This note was generated in part or whole with voice recognition software. Voice recognition is usually quite accurate but there are transcription errors that can and very often do occur. I apologize for any typographical errors that were not detected and corrected.   Medication Adjustments/Labs and Tests Ordered: Current medicines are reviewed at length with the patient today.  Concerns regarding medicines are outlined above.  Orders Placed This Encounter  Procedures   EKG 12-Lead   Meds ordered this encounter  Medications   metoprolol succinate (TOPROL XL) 25 MG 24 hr tablet    Sig: Take 0.5 tablets (12.5 mg total) by mouth daily. For heart rate.    Dispense:  90 tablet    Refill:  3    Patient Instructions  Medication Instructions:   Your physician recommends that you continue on your current medications as directed. Please refer to the Current Medication list given to you today.  *If you need a refill on your cardiac medications before your next appointment, please call your pharmacy*    Follow-Up: At [facility], you and your health needs are our priority.  As part of our continuing mission to provide you with exceptional heart care, we have created designated Provider Care Teams.  These Care Teams include your primary Cardiologist (physician) and Advanced Practice Providers (APPs -  Physician Assistants and Nurse Practitioners) who all work together to provide you  with the care you need, when you need it.  We recommend signing up for the patient portal called "MyChart".  Sign up information is provided on this After Visit Summary.  MyChart is used to connect with patients for Virtual Visits (Telemedicine).  Patients are able to view lab/test results, encounter notes, upcoming appointments, etc.  Non-urgent messages can be sent to your provider as well.   To learn more about what you can do with MyChart, go to [email or web address].    Your next appointment:   Follow up as needede   The format for your next appointment:   In Person  Provider:   Kate Sable, MD    Other Instructions   Important Information About Sugar  Signed, Kate Sable, MD  12/14/2021 9:59 AM    [place]

## 2022-02-21 ENCOUNTER — Ambulatory Visit: Payer: BC Managed Care – PPO | Admitting: Obstetrics and Gynecology

## 2022-02-21 ENCOUNTER — Encounter: Payer: Self-pay | Admitting: Obstetrics and Gynecology

## 2022-02-21 VITALS — BP 108/71 | HR 64 | Ht 66.0 in | Wt 161.0 lb

## 2022-02-21 DIAGNOSIS — N8110 Cystocele, unspecified: Secondary | ICD-10-CM | POA: Diagnosis not present

## 2022-02-21 DIAGNOSIS — R350 Frequency of micturition: Secondary | ICD-10-CM | POA: Diagnosis not present

## 2022-02-21 DIAGNOSIS — N393 Stress incontinence (female) (male): Secondary | ICD-10-CM

## 2022-02-21 LAB — POCT URINALYSIS DIPSTICK
Bilirubin, UA: NEGATIVE
Blood, UA: NEGATIVE
Glucose, UA: NEGATIVE
Ketones, UA: NEGATIVE
Leukocytes, UA: NEGATIVE
Nitrite, UA: NEGATIVE
Protein, UA: NEGATIVE
Spec Grav, UA: >=1.03 — AB (ref 1.010–1.025)
Urobilinogen, UA: 0.2 E.U./dL
pH, UA: 5.5 (ref 5.0–8.0)

## 2022-02-21 NOTE — Progress Notes (Signed)
[facility] New Patient Evaluation and Consultation  Referring Provider: Pleas Koch, NP PCP: Pleas Koch, NP Date of Service: 02/21/2022  SUBJECTIVE Chief Complaint: New Patient (Initial Visit)  History of Present Illness: Claudia Dawson is a 62 y.o. White or Caucasian female seen in consultation at the request of NP Alma Friendly for evaluation of prolapse.    Review of records significant for: Has symptoms of bothersome prolapse  Urinary Symptoms: Leaks urine with cough/ sneeze and with a full bladder Leaks rarely Pad use: none She is not bothered by her UI symptoms.  Day time voids 5-7.  Nocturia: 0-1 times per night to void. Voiding dysfunction: she empties her bladder well.  does not use a catheter to empty bladder.  When urinating, she feels difficulty starting urine stream and dribbling after finishing   UTIs:  0  UTI's in the last year.   Denies history of blood in urine and kidney or bladder stones  Pelvic Organ Prolapse Symptoms:                  She Admits to a feeling of a bulge the vaginal area. It has been present for 6+ years.  She Denies seeing a bulge.  This bulge is bothersome.  Bowel Symptom: Bowel movements: 1 time(s) per day Stool consistency: soft  Straining: no.  Splinting: no.  Incomplete evacuation: no.  She Denies accidental bowel leakage / fecal incontinence Bowel regimen:  probiotic  Sexual Function Sexually active: no.    Pelvic Pain Denies pelvic pain   Past Medical History:  Past Medical History:  Diagnosis Date   Allergy    Prediabetes    SVT (supraventricular tachycardia)      Past Surgical History:   Past Surgical History:  Procedure Laterality Date   BREAST CYST ASPIRATION Bilateral    CESAREAN SECTION     skin cancer removal     TUBAL LIGATION       Past OB/GYN History: OB History  Gravida Para Term Preterm AB Living  2 1 1     2   SAB IAB Ectopic Multiple Live Births           2    # Outcome Date GA Lbr Len/2nd Weight Sex Delivery Anes PTL Lv  2 Gravida           1 Term      CS-LTranv       Vaginal deliveries: 1,  Forceps/ Vacuum deliveries: 0, Cesarean section: 1 Menopausal: Denies vaginal bleeding since menopause Last pap smear was 2022- negative.     Medications: She has a current medication list which includes the following prescription(s): vitamin c, b complex vitamins, vitamin d, magnesium, metoprolol succinate, probiotic product, zinc sulfate, and calcium citrate.   Allergies: Patient has No Known Allergies.   Social History:  Social History   Tobacco Use   Smoking status: Never   Smokeless tobacco: Never  Vaping Use   Vaping Use: Never used  Substance Use Topics   Alcohol use: Yes    Comment: occation   Drug use: No    Relationship status: widowed She lives alone.   She is employed as a Control and instrumentation engineer. Regular exercise: Yes: walking, strength training, 3x per week bike and walking outside History of abuse: No  Family History:   Family History  Problem Relation Age of Onset   Hypertension Father    Atrial fibrillation Father    Heart disease Father  afib   Ulcers Father    Arrhythmia Father    Cancer Maternal Grandmother        Endometrial   Stroke Paternal Grandmother    Breast cancer Paternal Grandmother    Cancer Brother        kidney, unsure if malignant tumor   Cancer Maternal Aunt        half-aunt breast cancer     Review of Systems: Review of Systems  Constitutional:  Negative for fever, malaise/fatigue and weight loss.  Respiratory:  Negative for cough, shortness of breath and wheezing.   Cardiovascular:  Positive for palpitations. Negative for chest pain and leg swelling.  Gastrointestinal:  Negative for abdominal pain and blood in stool.  Genitourinary:  Negative for dysuria.       +vaginal discharge  Musculoskeletal:  Negative for myalgias.  Skin:  Negative for rash.  Neurological:  Positive  for headaches. Negative for dizziness.  Endo/Heme/Allergies:  Bruises/bleeds easily.       + hot flashes  Psychiatric/Behavioral:  Negative for depression. The patient is not nervous/anxious.      OBJECTIVE Physical Exam: Vitals:   02/21/22 1501  BP: 108/71  Pulse: 64  Weight: 161 lb (73 kg)  Height: 5\' 6"  (1.676 m)    Physical Exam Constitutional:      General: She is not in acute distress. Pulmonary:     Effort: Pulmonary effort is normal.  Abdominal:     General: There is no distension.     Palpations: Abdomen is soft.     Tenderness: There is no abdominal tenderness. There is no rebound.  Musculoskeletal:        General: No swelling. Normal range of motion.  Skin:    General: Skin is warm and dry.     Findings: No rash.  Neurological:     Mental Status: She is alert and oriented to person, place, and time.  Psychiatric:        Mood and Affect: Mood normal.        Behavior: Behavior normal.     GU / Detailed Urogynecologic Evaluation:  Pelvic Exam: Normal external female genitalia; Bartholin's and Skene's glands normal in appearance; urethral meatus normal in appearance, no urethral masses or discharge.   CST: negative  Speculum exam reveals normal vaginal mucosa with atrophy. Cervix normal appearance. Uterus normal single, nontender. Adnexa no mass, fullness, tenderness.     Pelvic floor strength II/V  Pelvic floor musculature: Right levator non-tender, Right obturator non-tender, Left levator non-tender, Left obturator non-tender  POP-Q:   POP-Q  -1                                            Aa   -1                                           Ba  -6                                              C   3.5  Gh  2.5                                            Pb  7                                            tvl   -2                                            Ap  -2                                             Bp  -6.5                                              D      Rectal Exam:  Normal external rectum  Post-Void Residual (PVR) by Bladder Scan: In order to evaluate bladder emptying, we discussed obtaining a postvoid residual and she agreed to this procedure.  Procedure: The ultrasound unit was placed on the patient's abdomen in the suprapubic region after the patient had voided. A PVR of 1 ml was obtained by bladder scan.  Laboratory Results: POC urine: negative   ASSESSMENT AND PLAN Ms. Westerfeld is a 62 y.o. with:  1. Prolapse of anterior vaginal wall   2. Urinary frequency   3. SUI (stress urinary incontinence, female)    Stage II anterior, Stage I posterior, Stage I apical prolapse - For treatment of pelvic organ prolapse, we discussed options for management including expectant management, conservative management, and surgical management, such as Kegels, a pessary, pelvic floor physical therapy, and specific surgical procedures. - She would like to do pelvic PT, referral placed to [facility] rehab at [facility]  2. SUI - not bothersome to her at this time. Will plan for pelvic PT  Return as needed  Jaquita Folds, MD

## 2022-03-24 ENCOUNTER — Ambulatory Visit: Payer: BC Managed Care – PPO | Admitting: Physical Therapy

## 2022-03-31 ENCOUNTER — Encounter: Payer: BC Managed Care – PPO | Admitting: Physical Therapy

## 2022-04-04 ENCOUNTER — Other Ambulatory Visit: Payer: Self-pay | Admitting: Primary Care

## 2022-04-04 ENCOUNTER — Encounter: Payer: Self-pay | Admitting: *Deleted

## 2022-04-04 DIAGNOSIS — I471 Supraventricular tachycardia, unspecified: Secondary | ICD-10-CM

## 2022-04-04 NOTE — Telephone Encounter (Signed)
Please call patient:  Received refill request from [facility] for met[facility]rolol. Looks like she has an active Rx at [facility]. Is she switching pharmacies?

## 2022-04-05 ENCOUNTER — Encounter: Payer: Self-pay | Admitting: Cardiology

## 2022-04-05 ENCOUNTER — Encounter: Payer: BC Managed Care – PPO | Admitting: Physical Therapy

## 2022-04-05 NOTE — Telephone Encounter (Signed)
Noted, Rx sent to [facility]

## 2022-04-05 NOTE — Telephone Encounter (Signed)
Patient returned call and said she wasn't aware it was sent to [name] and that she would like it sent into [facility]. Patient said she will call [facility] to cancel if it is already filled there. Please advise

## 2022-04-05 NOTE — Telephone Encounter (Signed)
Unable to reach patient. Left voicemail to return call to our office.   

## 2022-04-14 ENCOUNTER — Ambulatory Visit: Payer: BC Managed Care – PPO | Admitting: Physical Therapy

## 2022-04-21 ENCOUNTER — Encounter: Payer: BC Managed Care – PPO | Admitting: Physical Therapy

## 2022-04-28 ENCOUNTER — Ambulatory Visit: Payer: BC Managed Care – PPO | Admitting: Physical Therapy

## 2022-05-05 ENCOUNTER — Encounter: Payer: BC Managed Care – PPO | Admitting: Physical Therapy

## 2022-05-12 ENCOUNTER — Encounter: Payer: BC Managed Care – PPO | Admitting: Physical Therapy

## 2022-05-19 ENCOUNTER — Encounter: Payer: BC Managed Care – PPO | Admitting: Physical Therapy

## 2022-05-26 ENCOUNTER — Encounter: Payer: BC Managed Care – PPO | Admitting: Physical Therapy

## 2022-10-10 ENCOUNTER — Other Ambulatory Visit: Payer: Self-pay | Admitting: Primary Care

## 2022-10-10 DIAGNOSIS — I471 Supraventricular tachycardia, unspecified: Secondary | ICD-10-CM

## 2022-11-29 ENCOUNTER — Encounter: Payer: BC Managed Care – PPO | Admitting: Primary Care

## 2022-12-20 ENCOUNTER — Encounter: Payer: Self-pay | Admitting: Primary Care

## 2022-12-20 ENCOUNTER — Ambulatory Visit: Payer: BC Managed Care – PPO | Admitting: Primary Care

## 2022-12-20 VITALS — BP 110/76 | HR 60 | Temp 97.1°F | Ht 66.0 in | Wt 155.8 lb

## 2022-12-20 DIAGNOSIS — R197 Diarrhea, unspecified: Secondary | ICD-10-CM

## 2022-12-20 DIAGNOSIS — E559 Vitamin D deficiency, unspecified: Secondary | ICD-10-CM | POA: Diagnosis not present

## 2022-12-20 DIAGNOSIS — R7303 Prediabetes: Secondary | ICD-10-CM | POA: Diagnosis not present

## 2022-12-20 HISTORY — DX: Diarrhea, unspecified: R19.7

## 2022-12-20 LAB — COMPREHENSIVE METABOLIC PANEL
ALT: 22 U/L (ref 0–35)
AST: 22 U/L (ref 0–37)
Albumin: 4.5 g/dL (ref 3.5–5.2)
Alkaline Phosphatase: 71 U/L (ref 39–117)
BUN: 11 mg/dL (ref 6–23)
CO2: 31 meq/L (ref 19–32)
Calcium: 10 mg/dL (ref 8.4–10.5)
Chloride: 103 meq/L (ref 96–112)
Creatinine, Ser: 0.8 mg/dL (ref 0.40–1.20)
GFR: 78.63 mL/min (ref >60.00)
Glucose, Bld: 87 mg/dL (ref 70–99)
Potassium: 4.7 meq/L (ref 3.5–5.1)
Sodium: 140 meq/L (ref 135–145)
Total Bilirubin: 0.5 mg/dL (ref 0.2–1.2)
Total Protein: 7.5 g/dL (ref 6.0–8.3)

## 2022-12-20 LAB — CBC WITH DIFFERENTIAL/PLATELET
Basophils Absolute: 0 10*3/uL (ref 0.0–0.1)
Basophils Relative: 0.3 % (ref 0.0–3.0)
Eosinophils Absolute: 0.1 10*3/uL (ref 0.0–0.7)
Eosinophils Relative: 1.1 % (ref 0.0–5.0)
HCT: 41.1 % (ref 36.0–46.0)
Hemoglobin: 13.2 g/dL (ref 12.0–15.0)
Lymphocytes Relative: 41.6 % (ref 12.0–46.0)
Lymphs Abs: 2.4 10*3/uL (ref 0.7–4.0)
MCHC: 32 g/dL (ref 30.0–36.0)
MCV: 93.7 fL (ref 78.0–100.0)
Monocytes Absolute: 0.6 10*3/uL (ref 0.1–1.0)
Monocytes Relative: 11 % (ref 3.0–12.0)
Neutro Abs: 2.6 10*3/uL (ref 1.4–7.7)
Neutrophils Relative %: 46 % (ref 43.0–77.0)
Platelets: 222 10*3/uL (ref 150.0–400.0)
RBC: 4.39 Mil/uL (ref 3.87–5.11)
RDW: 13.5 % (ref 11.5–15.5)
WBC: 5.7 10*3/uL (ref 4.0–10.5)

## 2022-12-20 LAB — LIPID PANEL
Cholesterol: 215 mg/dL — ABNORMAL HIGH (ref 0–200)
HDL: 70.6 mg/dL (ref >39.00)
LDL Cholesterol: 125 mg/dL — ABNORMAL HIGH (ref 0–99)
NonHDL: 144.68
Total CHOL/HDL Ratio: 3
Triglycerides: 96 mg/dL (ref 0.0–149.0)
VLDL: 19.2 mg/dL (ref 0.0–40.0)

## 2022-12-20 LAB — VITAMIN D 25 HYDROXY (VIT D DEFICIENCY, FRACTURES): VITD: 42.09 ng/mL (ref 30.00–100.00)

## 2022-12-20 LAB — HEMOGLOBIN A1C: Hgb A1c MFr Bld: 5.7 % (ref 4.6–6.5)

## 2022-12-20 NOTE — Progress Notes (Signed)
Subjective:    Patient ID: Claudia Dawson, female    DOB: May 10, 1960, 62 y.o.   MRN: [id number]  HPI  Claudia Dawson is a very pleasant 62 y.o. female with a history of migraines, prediabetes, seasonal allergies who presents today to discuss diarrhea.  She is also due for her annual CPE labs for which has been scheduled for later this month.   Symptoms initially began 3-4 weeks ago with softer stools and increased frequency of stools. Since then she's experiencing diarrhea once to twice weekly, typically occurring late at night or early in the morning. Stools start soft, then change to liquid. She will have softer stools at work on occasion.   Her most recent episode was two evenings ago. She denies abdominal pain, nausea, vomiting, fevers, chills, increased fiber intake, changes in her diet except for drinking "mushroom coffee" for which she started in May 2024. She has been taking magnesium nighty since March 2024. She stopped her magnesium and mushroom coffee two days ago.   Her last colonoscopy was in 2023, normal, due for recall in 2033.  She's lost weight, not eating as much as she's afraid of having diarrhea. She's limiting dairy.   Review of Systems  Constitutional:  Negative for chills and fever.  Respiratory:  Negative for shortness of breath.   Cardiovascular:  Negative for chest pain.  Gastrointestinal:  Positive for diarrhea. Negative for abdominal pain, nausea and vomiting.         Past Medical History:  Diagnosis Date   Allergy    Prediabetes    SVT (supraventricular tachycardia) (HCC)     Social History   Socioeconomic History   Marital status: Widowed    Spouse name: Not on file   Number of children: Not on file   Years of education: Not on file   Highest education level: Not on file  Occupational History   Not on file  Tobacco Use   Smoking status: Never   Smokeless tobacco: Never  Vaping Use   Vaping status: Never Used   Substance and Sexual Activity   Alcohol use: Yes    Comment: occation   Drug use: No   Sexual activity: Not Currently  Other Topics Concern   Not on file  Social History Narrative   Lives in [place].    Husband died of MI at age 54 in March 21, 2012.    Daughters live nearby.   Work - Geologist, engineering for first grade.   Exercise - walks and lifts weights with group from church.   Social Determinants of Health   Financial Resource Strain: Not on file  Food Insecurity: Not on file  Transportation Needs: Not on file  Physical Activity: Not on file  Stress: Not on file  Social Connections: Not on file  Intimate Partner Violence: Not on file    Past Surgical History:  Procedure Laterality Date   BREAST CYST ASPIRATION Bilateral    CESAREAN SECTION     skin cancer removal     TUBAL LIGATION      Family History  Problem Relation Age of Onset   Hypertension Father    Atrial fibrillation Father    Heart disease Father        afib   Ulcers Father    Arrhythmia Father    Cancer Maternal Grandmother        Endometrial   Stroke Paternal Grandmother    Breast cancer Paternal Grandmother    Cancer Brother  kidney, unsure if malignant tumor   Cancer Maternal Aunt        half-aunt breast cancer    No Known Allergies  Current Outpatient Medications on File Prior to Visit  Medication Sig Dispense Refill   Ascorbic Acid (VITAMIN C) 1000 MG tablet Take 1,000 mg by mouth daily.     b complex vitamins tablet Take 1 tablet by mouth daily.     Cholecalciferol (VITAMIN D) 50 MCG (2000 UT) CAPS Take by mouth daily.     MAGNESIUM PO Take by mouth.     metoprolol succinate (TOPROL-XL) 25 MG 24 hr tablet TAKE 0.5 TABLETS (12.5 MG TOTAL) BY MOUTH DAILY. FOR HEART RATE. 45 tablet 0   Probiotic Product (PROBIOTIC DAILY PO) Take by mouth every other day.      No current facility-administered medications on file prior to visit.    BP 110/76   Pulse 60   Temp (!) 97.1 F  (36.2 C) (Temporal)   Ht 5\' 6"  (1.676 m)   Wt 155 lb 12.8 oz (70.7 kg)   SpO2 97%   BMI 25.15 kg/m  Objective:   Physical Exam Cardiovascular:     Rate and Rhythm: Normal rate and regular rhythm.  Pulmonary:     Effort: Pulmonary effort is normal.     Breath sounds: Normal breath sounds.  Abdominal:     General: Bowel sounds are normal.     Palpations: Abdomen is soft.     Tenderness: There is no abdominal tenderness.  Musculoskeletal:     Cervical back: Neck supple.  Skin:    General: Skin is warm and dry.  Neurological:     Mental Status: She is alert and oriented to person, place, and time.  Psychiatric:        Mood and Affect: Mood normal.           Assessment & Plan:  Acute diarrhea Assessment & Plan: Unclear cause. Not representative of diverticulitis or colitis.   Discussed to remain off magnesium and mushroom coffee.   Checking labs today including stool studies, alpha gal, CMP, CBC with diff.  Reviewed colonoscopy from 2023. Await results and updates.   Orders: -     Giardia antigen -     Gastrointestinal Pathogen Pnl RT, PCR -     C. difficile GDH and Toxin A/B -     CBC with Differential/Platelet -     Comprehensive metabolic panel -     Alpha-Gal Panel  Prediabetes -     Hemoglobin A1c -     Lipid panel  Vitamin D deficiency -     VITAMIN D 25 Hydroxy (Vit-D Deficiency, Fractures)        Doreene Nest, NP

## 2022-12-20 NOTE — Addendum Note (Signed)
Addended by: Alvina Chou on: 12/20/2022 10:52 AM   Modules accepted: Orders

## 2022-12-20 NOTE — Assessment & Plan Note (Addendum)
Unclear cause. Not representative of diverticulitis or colitis.   Discussed to remain off magnesium and mushroom coffee.   Checking labs today including stool studies, alpha gal, CMP, CBC with diff.  Reviewed colonoscopy from 2023. Await results and updates.

## 2022-12-24 LAB — INTERPRETATION:

## 2022-12-24 LAB — ALPHA-GAL PANEL
Allergen, Mutton, f88: <0.1 kU/L
Allergen, Pork, f26: <0.1 kU/L
Beef: <0.1 kU/L
CLASS: 0
CLASS: 0
Class: 0
GALACTOSE-ALPHA-1,3-GALACTOSE IGE*: <0.1 kU/L

## 2022-12-29 ENCOUNTER — Other Ambulatory Visit: Payer: Self-pay

## 2022-12-29 DIAGNOSIS — R197 Diarrhea, unspecified: Secondary | ICD-10-CM

## 2022-12-30 LAB — C. DIFFICILE GDH AND TOXIN A/B
GDH ANTIGEN: NOT DETECTED
MICRO NUMBER:: 15731365
SPECIMEN QUALITY:: ADEQUATE
TOXIN A AND B: NOT DETECTED

## 2023-01-03 LAB — GASTROINTESTINAL PATHOGEN PNL
CampyloBacter Group: NOT DETECTED
Norovirus GI/GII: NOT DETECTED
Rotavirus A: NOT DETECTED
Salmonella species: NOT DETECTED
Shiga Toxin 1: NOT DETECTED
Shiga Toxin 2: NOT DETECTED
Shigella Species: NOT DETECTED
Vibrio Group: NOT DETECTED
Yersinia enterocolitica: NOT DETECTED

## 2023-01-03 LAB — GIARDIA ANTIGEN
MICRO NUMBER:: 15731308
RESULT:: NOT DETECTED
SPECIMEN QUALITY:: ADEQUATE

## 2023-01-11 ENCOUNTER — Encounter: Payer: Self-pay | Admitting: Primary Care

## 2023-01-11 ENCOUNTER — Other Ambulatory Visit: Payer: Self-pay | Admitting: Primary Care

## 2023-01-11 ENCOUNTER — Ambulatory Visit: Payer: BC Managed Care – PPO | Admitting: Primary Care

## 2023-01-11 VITALS — BP 110/64 | HR 57 | Temp 97.3°F | Ht 66.0 in | Wt 154.0 lb

## 2023-01-11 DIAGNOSIS — Z1231 Encounter for screening mammogram for malignant neoplasm of breast: Secondary | ICD-10-CM

## 2023-01-11 DIAGNOSIS — Z0000 Encounter for general adult medical examination without abnormal findings: Secondary | ICD-10-CM

## 2023-01-11 DIAGNOSIS — R197 Diarrhea, unspecified: Secondary | ICD-10-CM | POA: Diagnosis not present

## 2023-01-11 DIAGNOSIS — R7303 Prediabetes: Secondary | ICD-10-CM

## 2023-01-11 DIAGNOSIS — I471 Supraventricular tachycardia, unspecified: Secondary | ICD-10-CM

## 2023-01-11 DIAGNOSIS — N8110 Cystocele, unspecified: Secondary | ICD-10-CM

## 2023-01-11 NOTE — Assessment & Plan Note (Signed)
Immunizations UTD. Declines influenza vaccine.  Pap smear UTD. Mammogram due, orders placed. Colonoscopy UTD, due 2033  Discussed the importance of a healthy diet and regular exercise in order for weight loss, and to reduce the risk of further co-morbidity.  Exam stable. Labs pending.  Follow up in 1 year for repeat physical.

## 2023-01-11 NOTE — Assessment & Plan Note (Signed)
Resolved.  Discussed that magnesium could have contributed. She will update if symptoms return.

## 2023-01-11 NOTE — Assessment & Plan Note (Addendum)
Chronic and continued.  Following with Uro/GYN. Discussed options for treatment.  Referral placed for pelvic floor PT.

## 2023-01-11 NOTE — Assessment & Plan Note (Signed)
Improved. Reviewed A1C from November 2024.  Continue to monitor.

## 2023-01-11 NOTE — Patient Instructions (Signed)
You will either be contacted via phone regarding your referral to pelvic floor physical therapy, or you may receive a letter on your MyChart portal from our referral team with instructions for scheduling an appointment. Please let us know if you have not been contacted by anyone within two weeks.  It was a pleasure to see you today!

## 2023-01-11 NOTE — Progress Notes (Signed)
Subjective:    Patient ID: Claudia Dawson, female    DOB: 1960/06/19, 62 y.o.   MRN: [id number]  HPI  Claudia Dawson is a very pleasant 62 y.o. female who presents today for complete physical and follow up of chronic conditions.  Immunizations: -Tetanus: Completed in 2015 -Influenza: Declines influenza vaccine.  -Shingles: Completed Shingrix series  Diet: Fair diet.  Exercise: Some walking.  Eye exam: Completes annually  Dental exam: Completes semi-annually    Pap Smear: October 2022 Mammogram: June 2022  Colonoscopy: Completed in 2023, due 2033  BP Readings from Last 3 Encounters:  01/11/23 110/64  12/20/22 110/76  02/21/22 108/71       Review of Systems  Constitutional:  Negative for unexpected weight change.  HENT:  Negative for rhinorrhea.   Respiratory:  Negative for cough and shortness of breath.   Cardiovascular:  Negative for chest pain.  Gastrointestinal:  Negative for constipation and diarrhea.  Genitourinary:  Negative for difficulty urinating.  Musculoskeletal:  Negative for arthralgias and myalgias.  Skin:  Negative for rash.  Allergic/Immunologic: Negative for environmental allergies.  Neurological:  Negative for dizziness and headaches.  Psychiatric/Behavioral:  The patient is not nervous/anxious.          Past Medical History:  Diagnosis Date   Acute diarrhea 12/20/2022   Allergy    Breast nodule 11/08/2013   Oral ulcer 08/14/2018   Prediabetes    SVT (supraventricular tachycardia) (HCC)     Social History   Socioeconomic History   Marital status: Widowed    Spouse name: Not on file   Number of children: Not on file   Years of education: Not on file   Highest education level: Not on file  Occupational History   Not on file  Tobacco Use   Smoking status: Never   Smokeless tobacco: Never  Vaping Use   Vaping status: Never Used  Substance and Sexual Activity   Alcohol use: Yes    Comment: occation   Drug  use: No   Sexual activity: Not Currently  Other Topics Concern   Not on file  Social History Narrative   Lives in [place].    Husband died of MI at age 59 in 02/19/12.    Daughters live nearby.   Work - Geologist, engineering for first grade.   Exercise - walks and lifts weights with group from church.   Social Determinants of Health   Financial Resource Strain: Not on file  Food Insecurity: Not on file  Transportation Needs: Not on file  Physical Activity: Not on file  Stress: Not on file  Social Connections: Not on file  Intimate Partner Violence: Not on file    Past Surgical History:  Procedure Laterality Date   BREAST CYST ASPIRATION Bilateral    CESAREAN SECTION     skin cancer removal     TUBAL LIGATION      Family History  Problem Relation Age of Onset   Hypertension Father    Atrial fibrillation Father    Heart disease Father        afib   Ulcers Father    Arrhythmia Father    Cancer Maternal Grandmother        Endometrial   Stroke Paternal Grandmother    Breast cancer Paternal Grandmother    Cancer Brother        kidney, unsure if malignant tumor   Cancer Maternal Aunt        half-aunt breast  cancer    No Known Allergies  Current Outpatient Medications on File Prior to Visit  Medication Sig Dispense Refill   Cholecalciferol (VITAMIN D) 50 MCG (2000 UT) CAPS Take by mouth daily.     metoprolol succinate (TOPROL-XL) 25 MG 24 hr tablet TAKE 0.5 TABLETS (12.5 MG TOTAL) BY MOUTH DAILY. FOR HEART RATE. 45 tablet 0   Probiotic Product (PROBIOTIC DAILY PO) Take by mouth every other day.      Ascorbic Acid (VITAMIN C) 1000 MG tablet Take 1,000 mg by mouth daily. (Patient not taking: Reported on 01/11/2023)     b complex vitamins tablet Take 1 tablet by mouth daily. (Patient not taking: Reported on 01/11/2023)     MAGNESIUM PO Take by mouth. (Patient not taking: Reported on 01/11/2023)     No current facility-administered medications on file prior to visit.     BP 110/64   Pulse (!) 57   Temp (!) 97.3 F (36.3 C) (Temporal)   Ht 5\' 6"  (1.676 m)   Wt 154 lb (69.9 kg)   SpO2 98%   BMI 24.86 kg/m  Objective:   Physical Exam HENT:     Right Ear: Tympanic membrane and ear canal normal.     Left Ear: Tympanic membrane and ear canal normal.  Eyes:     Pupils: Pupils are equal, round, and reactive to light.  Cardiovascular:     Rate and Rhythm: Normal rate and regular rhythm.  Pulmonary:     Effort: Pulmonary effort is normal.     Breath sounds: Normal breath sounds.  Abdominal:     General: Bowel sounds are normal.     Palpations: Abdomen is soft.     Tenderness: There is no abdominal tenderness.  Musculoskeletal:        General: Normal range of motion.     Cervical back: Neck supple.  Skin:    General: Skin is warm and dry.  Neurological:     Mental Status: She is alert and oriented to person, place, and time.     Cranial Nerves: No cranial nerve deficit.     Deep Tendon Reflexes:     Reflex Scores:      Patellar reflexes are 2+ on the right side and 2+ on the left side. Psychiatric:        Mood and Affect: Mood normal.           Assessment & Plan:  Routine general medical examination at a health care facility Assessment & Plan: Immunizations UTD. Declines influenza vaccine.  Pap smear UTD. Mammogram due, orders placed. Colonoscopy UTD, due 2033  Discussed the importance of a healthy diet and regular exercise in order for weight loss, and to reduce the risk of further co-morbidity.  Exam stable. Labs pending.  Follow up in 1 year for repeat physical.    Acute diarrhea Assessment & Plan: Resolved.  Discussed that magnesium could have contributed. She will update if symptoms return.    Prediabetes Assessment & Plan: Improved. Reviewed A1C from November 2024.  Continue to monitor.    SVT (supraventricular tachycardia) (HCC) Assessment & Plan: Controlled.  Continue metoprolol 12.5 mg daily.   Reviewed cardiology notes from October 2023.   Vaginal prolapse Assessment & Plan: Chronic and continued.  Following with Uro/GYN. Discussed options for treatment.  Referral placed for pelvic floor PT.    Orders: -     Ambulatory referral to Physical Therapy  Screening mammogram for breast cancer -  3D Screening Mammogram, Left and Right; Future        Doreene Nest, NP

## 2023-01-11 NOTE — Assessment & Plan Note (Signed)
Controlled.  Continue metoprolol 12.5 mg daily.  Reviewed cardiology notes from October 2023.

## 2023-01-21 ENCOUNTER — Encounter: Payer: Self-pay | Admitting: *Deleted

## 2023-01-31 NOTE — Telephone Encounter (Signed)
Patient would like PT referral resent to be at [facility] [place].

## 2023-02-16 ENCOUNTER — Ambulatory Visit
Admission: RE | Admit: 2023-02-16 | Discharge: 2023-02-16 | Disposition: A | Discharge status: Home / Self Care | Payer: 59 | Source: Ambulatory Visit | Attending: Primary Care | Admitting: Primary Care

## 2023-02-16 DIAGNOSIS — Z1231 Encounter for screening mammogram for malignant neoplasm of breast: Secondary | ICD-10-CM | POA: Diagnosis present

## 2023-02-20 ENCOUNTER — Telehealth: Payer: Self-pay | Admitting: Primary Care

## 2023-02-20 NOTE — Telephone Encounter (Signed)
 Copied from CRM [id number]. Topic: Referral - Status >> Feb 20, 2023  9:24 AM Corean SAUNDERS wrote: Reason for CRM: Patient is requesting the referral/order to be re sent to [facility] as the front desk at [facility] states they did not receive this. [address] [place], [place] [address] Phone [phone] Fax [phone] Pelvic Floor Specialist

## 2023-05-06 IMAGING — MG MM DIGITAL SCREENING BILAT W/ TOMO AND CAD
8 series · 9 of 24 positions shown · non-contrast
Comparison: Previous exam(s).

CLINICAL DATA: Screening.

EXAM:
DIGITAL SCREENING BILATERAL MAMMOGRAM WITH TOMOSYNTHESIS AND CAD
TECHNIQUE: Bilateral screening digital craniocaudal and mediolateral oblique
mammograms were obtained. Bilateral screening digital breast
tomosynthesis was performed. The images were evaluated with
computer-aided detection.

[R MLO synth-2D]
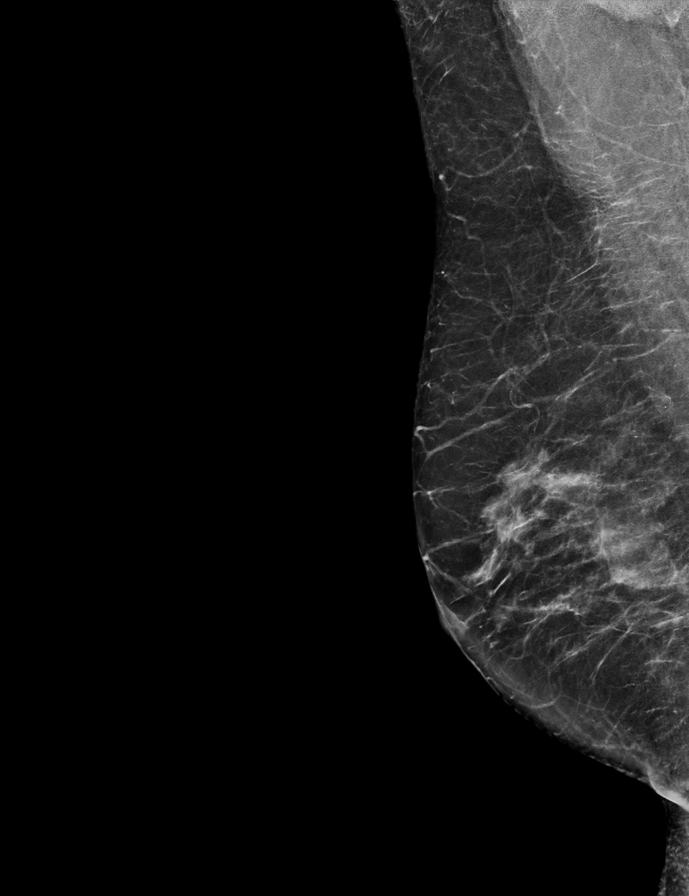

[L MLO synth-2D]
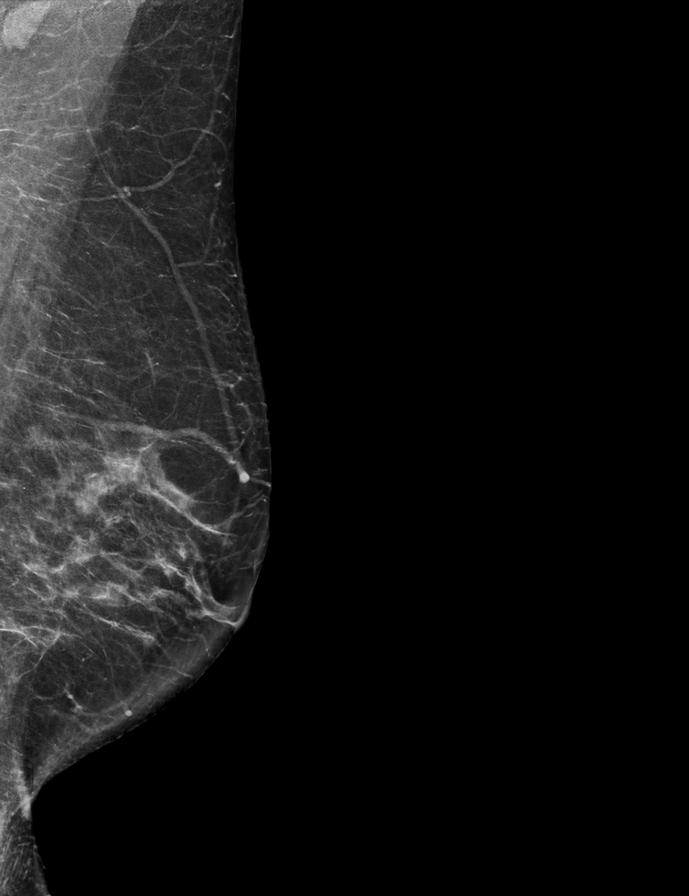

[R CC synth-2D]
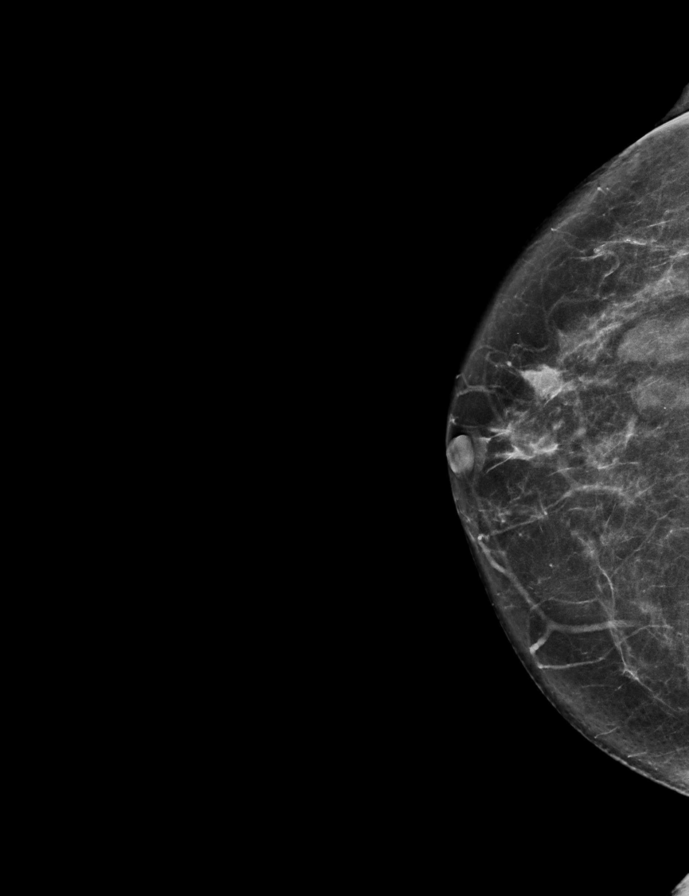

[L CC synth-2D]
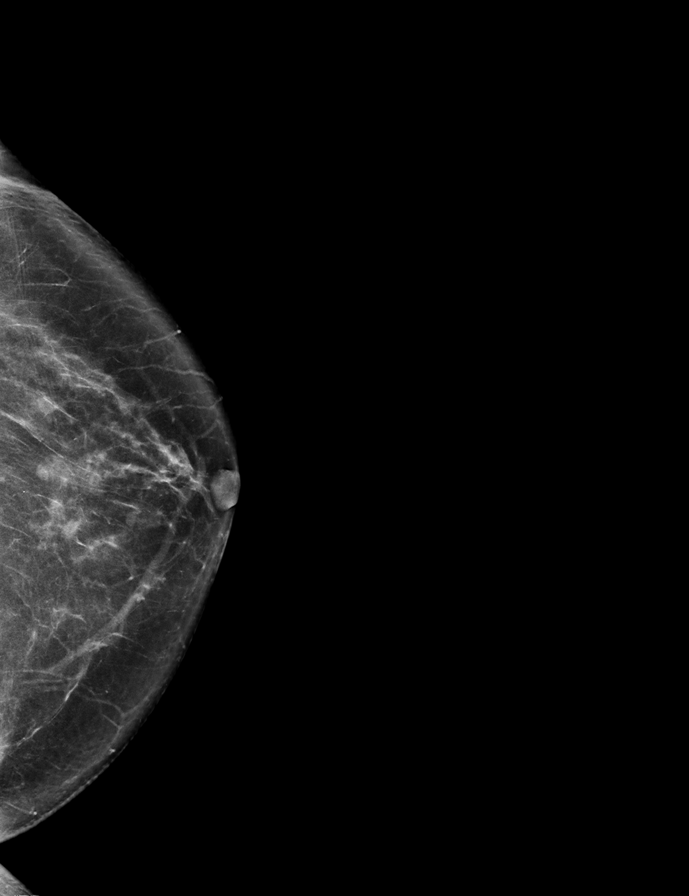

[R CC tomo · 2 of 69 frames shown]
[frame 23/69]
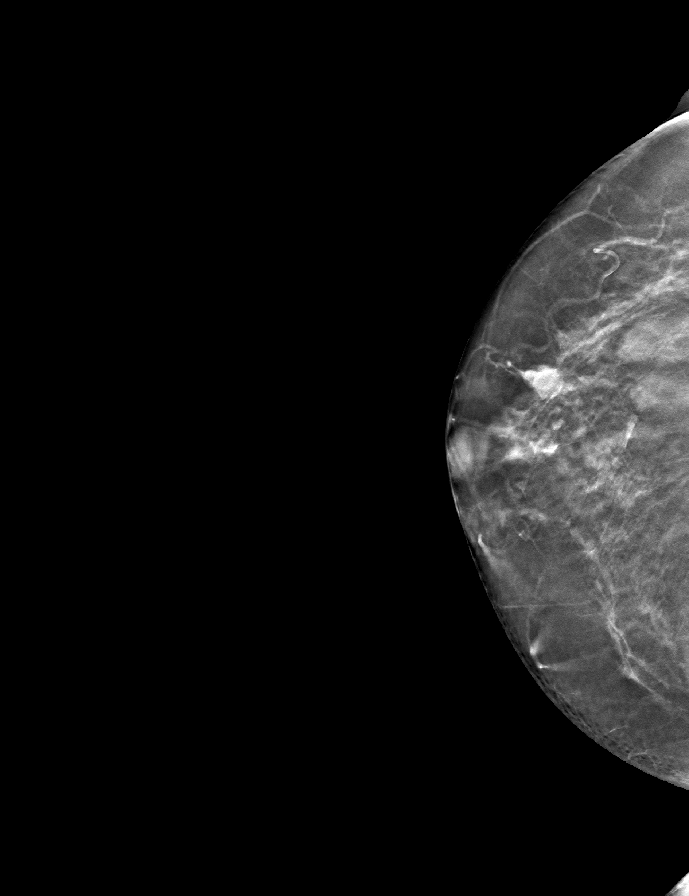
[frame 35/69]
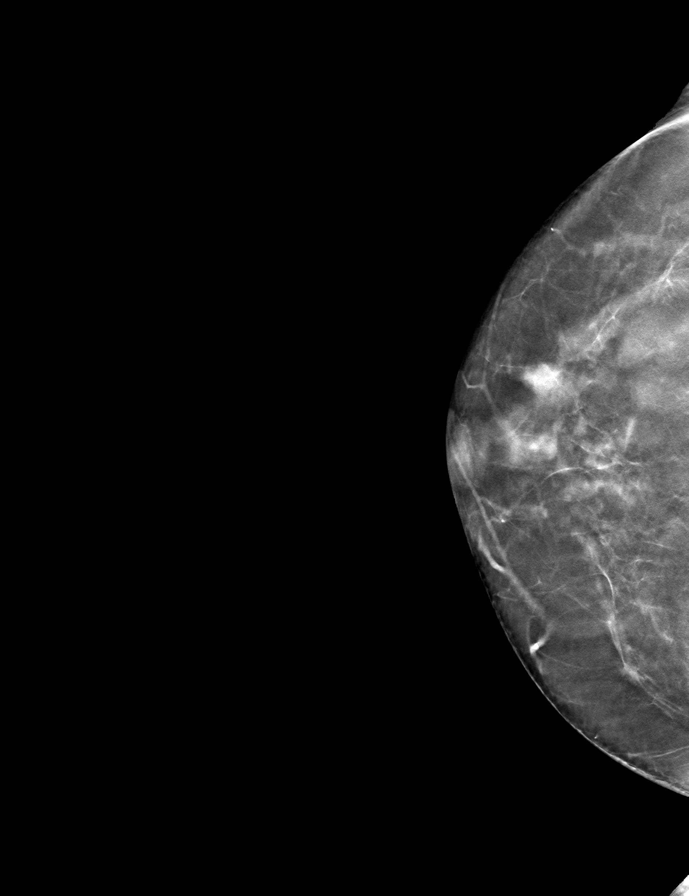

[R MLO tomo · tomo slice 33/65.0]
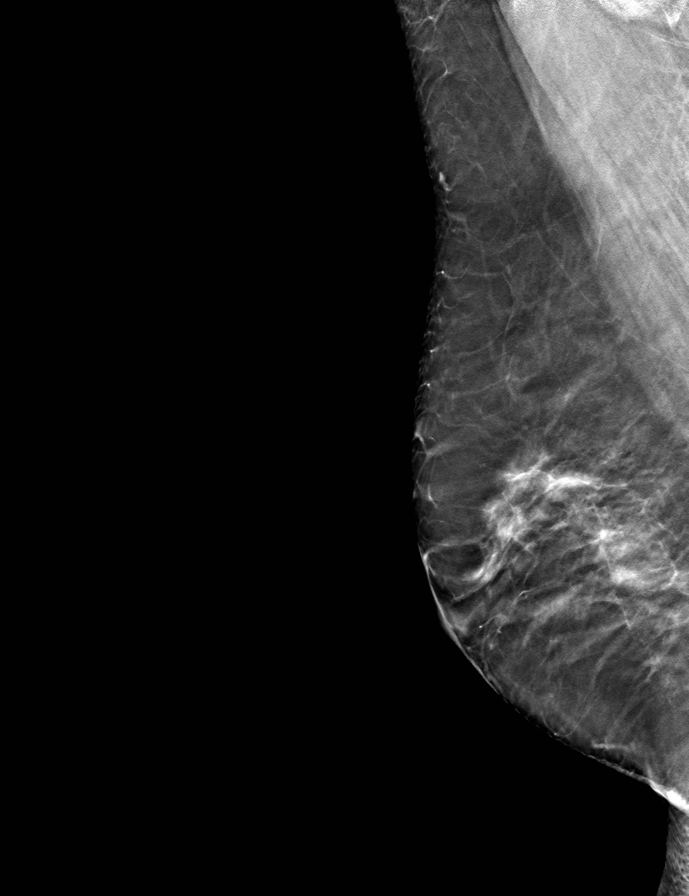

[L CC tomo · tomo slice 37/72.0]
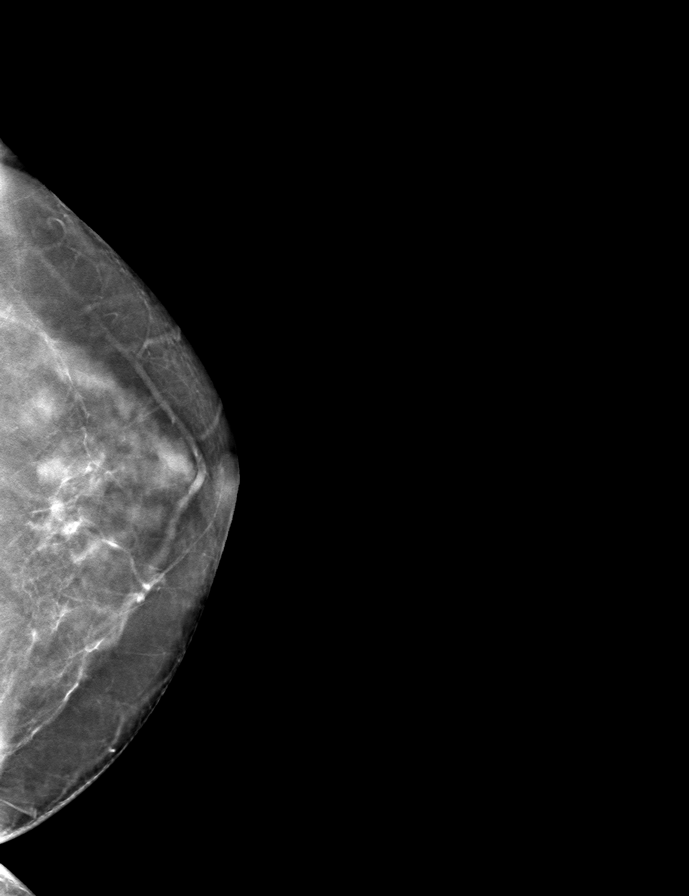

[L MLO tomo · tomo slice 33/66.0]
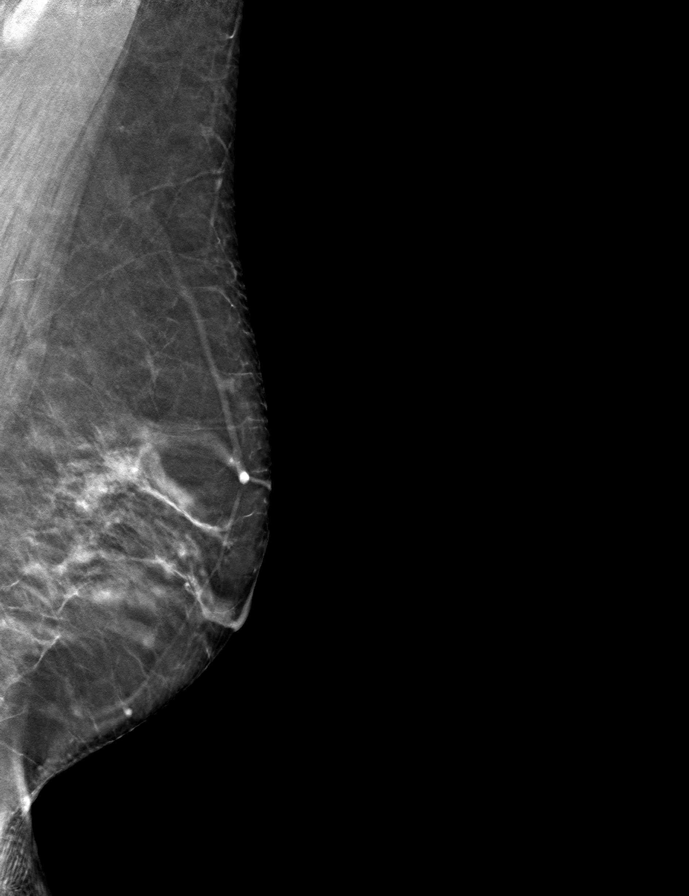

[9 of 24 positions shown; findings below may reference images not displayed]

ACR Breast Density Category b: There are scattered areas of
fibroglandular density.
FINDINGS: There are no findings suspicious for malignancy. Multiple waxing and
waning circumscribed oval masses within both breasts are compatible
with a benign process.
IMPRESSION: No mammographic evidence of malignancy. A result letter of this
screening mammogram will be mailed directly to the patient.

RECOMMENDATION:
Screening mammogram in one year. (Code:UT-2-V3G)

BI-RADS CATEGORY  2: Benign.

## 2023-11-07 ENCOUNTER — Telehealth: Payer: Self-pay

## 2023-11-07 DIAGNOSIS — E785 Hyperlipidemia, unspecified: Secondary | ICD-10-CM

## 2023-11-07 DIAGNOSIS — E559 Vitamin D deficiency, unspecified: Secondary | ICD-10-CM

## 2023-11-07 DIAGNOSIS — R7303 Prediabetes: Secondary | ICD-10-CM

## 2023-11-07 NOTE — Telephone Encounter (Signed)
Noted, lab orders placed. 

## 2023-11-07 NOTE — Telephone Encounter (Signed)
 Copied from CRM #[id number]. Topic: Clinical - Request for Lab/Test Order >> Nov 07, 2023 12:07 PM Donna BRAVO wrote: Reason for CRM: patient would like labs ordered and drawn before appt on 01/16/24 Patient uses MyChart

## 2023-11-07 NOTE — Addendum Note (Signed)
 Addended by: Chari Parmenter K on: 11/07/2023 02:03 PM   Modules accepted: Orders

## 2024-01-03 DIAGNOSIS — E559 Vitamin D deficiency, unspecified: Secondary | ICD-10-CM

## 2024-01-03 DIAGNOSIS — E785 Hyperlipidemia, unspecified: Secondary | ICD-10-CM

## 2024-01-03 DIAGNOSIS — R7303 Prediabetes: Secondary | ICD-10-CM

## 2024-01-03 DIAGNOSIS — I471 Supraventricular tachycardia, unspecified: Secondary | ICD-10-CM

## 2024-01-09 ENCOUNTER — Other Ambulatory Visit (INDEPENDENT_AMBULATORY_CARE_PROVIDER_SITE_OTHER)

## 2024-01-09 ENCOUNTER — Ambulatory Visit: Payer: Self-pay | Admitting: Primary Care

## 2024-01-09 DIAGNOSIS — R7303 Prediabetes: Secondary | ICD-10-CM

## 2024-01-09 DIAGNOSIS — E559 Vitamin D deficiency, unspecified: Secondary | ICD-10-CM | POA: Diagnosis not present

## 2024-01-09 DIAGNOSIS — I471 Supraventricular tachycardia, unspecified: Secondary | ICD-10-CM | POA: Diagnosis not present

## 2024-01-09 DIAGNOSIS — E785 Hyperlipidemia, unspecified: Secondary | ICD-10-CM

## 2024-01-09 LAB — MAGNESIUM: Magnesium: 2 mg/dL (ref 1.5–2.5)

## 2024-01-09 LAB — COMPREHENSIVE METABOLIC PANEL WITH GFR
ALT: 25 U/L (ref 0–35)
AST: 23 U/L (ref 0–37)
Albumin: 4.5 g/dL (ref 3.5–5.2)
Alkaline Phosphatase: 71 U/L (ref 39–117)
BUN: 16 mg/dL (ref 6–23)
CO2: 29 meq/L (ref 19–32)
Calcium: 9.6 mg/dL (ref 8.4–10.5)
Chloride: 103 meq/L (ref 96–112)
Creatinine, Ser: 0.77 mg/dL (ref 0.40–1.20)
GFR: 81.72 mL/min (ref >60.00)
Glucose, Bld: 80 mg/dL (ref 70–99)
Potassium: 4.2 meq/L (ref 3.5–5.1)
Sodium: 139 meq/L (ref 135–145)
Total Bilirubin: 0.6 mg/dL (ref 0.2–1.2)
Total Protein: 7 g/dL (ref 6.0–8.3)

## 2024-01-09 LAB — HEMOGLOBIN A1C: Hgb A1c MFr Bld: 5.7 % (ref 4.6–6.5)

## 2024-01-09 LAB — LIPID PANEL
Cholesterol: 220 mg/dL — ABNORMAL HIGH (ref 0–200)
HDL: 67.1 mg/dL (ref >39.00)
LDL Cholesterol: 135 mg/dL — ABNORMAL HIGH (ref 0–99)
NonHDL: 152.47
Total CHOL/HDL Ratio: 3
Triglycerides: 89 mg/dL (ref 0.0–149.0)
VLDL: 17.8 mg/dL (ref 0.0–40.0)

## 2024-01-09 LAB — VITAMIN B12: Vitamin B-12: 254 pg/mL (ref 211–911)

## 2024-01-09 LAB — VITAMIN D 25 HYDROXY (VIT D DEFICIENCY, FRACTURES): VITD: 49.44 ng/mL (ref 30.00–100.00)

## 2024-01-16 ENCOUNTER — Encounter: Payer: Self-pay | Admitting: Primary Care

## 2024-01-16 ENCOUNTER — Other Ambulatory Visit (HOSPITAL_COMMUNITY)
Admission: RE | Admit: 2024-01-16 | Discharge: 2024-01-16 | Disposition: A | Source: Ambulatory Visit | Attending: Primary Care | Admitting: Primary Care

## 2024-01-16 ENCOUNTER — Ambulatory Visit (INDEPENDENT_AMBULATORY_CARE_PROVIDER_SITE_OTHER): Admitting: Primary Care

## 2024-01-16 VITALS — BP 108/68 | HR 60 | Temp 97.9°F | Ht 65.5 in | Wt 166.4 lb

## 2024-01-16 DIAGNOSIS — Z0000 Encounter for general adult medical examination without abnormal findings: Secondary | ICD-10-CM

## 2024-01-16 DIAGNOSIS — R7303 Prediabetes: Secondary | ICD-10-CM

## 2024-01-16 DIAGNOSIS — G43009 Migraine without aura, not intractable, without status migrainosus: Secondary | ICD-10-CM | POA: Diagnosis not present

## 2024-01-16 DIAGNOSIS — Z23 Encounter for immunization: Secondary | ICD-10-CM

## 2024-01-16 DIAGNOSIS — Z1231 Encounter for screening mammogram for malignant neoplasm of breast: Secondary | ICD-10-CM

## 2024-01-16 DIAGNOSIS — Z124 Encounter for screening for malignant neoplasm of cervix: Secondary | ICD-10-CM

## 2024-01-16 DIAGNOSIS — I471 Supraventricular tachycardia, unspecified: Secondary | ICD-10-CM

## 2024-01-16 NOTE — Assessment & Plan Note (Signed)
 No concerns today. Continue to monitor.

## 2024-01-16 NOTE — Assessment & Plan Note (Signed)
 Tetanus updated today. Declines influenza vaccine.  Pap smear due, completed today Mammogram due in January 2026, orders placed. Colonoscopy UTD, due 2033  Discussed the importance of a healthy diet and regular exercise in order for weight loss, and to reduce the risk of further co-morbidity.  Exam stable. Labs pending.  Follow up in 1 year for repeat physical.

## 2024-01-16 NOTE — Assessment & Plan Note (Signed)
 Repeat A1C pending.  Work on a healthy diet and regular exercise in order for weight loss, and to reduce the risk of further co-morbidity.

## 2024-01-16 NOTE — Addendum Note (Signed)
 Addended by: Amaira Safley on: 01/16/2024 12:42 PM   Modules accepted: Orders

## 2024-01-16 NOTE — Progress Notes (Signed)
 Subjective:    Patient ID: Claudia Dawson, female    DOB: 01-Jun-1960, 63 y.o.   MRN: [id number]  Claudia Dawson is a very pleasant 63 y.o. female who presents today for complete physical and follow up of chronic conditions.  Immunizations: -Tetanus: Completed in 2015 -Influenza: Declines influenza vaccine  -Shingles: Completed Shingrix series  Diet: Fair diet.  Exercise: Regular exercise.  Eye exam: Completed last year  Dental exam: Completes semi-annually    Pap Smear: Completed in October 2022 Mammogram: Completed in Feb 27, 2023 Colonoscopy: Completed in 2023, due 2033  BP Readings from Last 3 Encounters:  01/16/24 108/68  01/11/23 110/64  12/20/22 110/76    Wt Readings from Last 3 Encounters:  01/16/24 166 lb 6 oz (75.5 kg)  01/11/23 154 lb (69.9 kg)  12/20/22 155 lb 12.8 oz (70.7 kg)      Review of Systems  Constitutional:  Negative for unexpected weight change.  HENT:  Negative for rhinorrhea.   Respiratory:  Negative for cough and shortness of breath.   Cardiovascular:  Negative for chest pain.  Gastrointestinal:  Negative for constipation and diarrhea.  Genitourinary:  Negative for difficulty urinating.  Musculoskeletal:  Positive for arthralgias. Negative for myalgias.  Skin:  Negative for rash.  Allergic/Immunologic: Negative for environmental allergies.  Neurological:  Negative for dizziness and headaches.  Psychiatric/Behavioral:  The patient is not nervous/anxious.          Past Medical History:  Diagnosis Date   Acute diarrhea 12/20/2022   Allergy    Breast nodule 11/08/2013   Oral ulcer 08/14/2018   Prediabetes    SVT (supraventricular tachycardia)     Social History   Socioeconomic History   Marital status: Widowed    Spouse name: Not on file   Number of children: Not on file   Years of education: Not on file   Highest education level: Not on file  Occupational History   Not on file  Tobacco Use   Smoking  status: Never   Smokeless tobacco: Never  Vaping Use   Vaping status: Never Used  Substance and Sexual Activity   Alcohol use: Yes    Comment: occation   Drug use: No   Sexual activity: Not Currently  Other Topics Concern   Not on file  Social History Narrative   Lives in [place].    Husband died of MI at age 14 in 02-27-2012.    Daughters live nearby.   Work - geologist, engineering for first grade.   Exercise - walks and lifts weights with group from church.   Social Drivers of Corporate Investment Banker Strain: Not on file  Food Insecurity: Not on file  Transportation Needs: Not on file  Physical Activity: Not on file  Stress: Not on file  Social Connections: Not on file  Intimate Partner Violence: Not on file    Past Surgical History:  Procedure Laterality Date   BREAST CYST ASPIRATION Bilateral    CESAREAN SECTION  01/17/1988   skin cancer removal     TUBAL LIGATION      Family History  Problem Relation Age of Onset   Hypertension Father    Atrial fibrillation Father    Heart disease Father        afib   Ulcers Father    Arrhythmia Father    Arthritis Father    Cancer Maternal Grandmother        Endometrial   Stroke Paternal Grandmother  Breast cancer Paternal Grandmother    Cancer Brother        kidney, unsure if malignant tumor   Cancer Maternal Aunt        half-aunt breast cancer    No Known Allergies  Current Outpatient Medications on File Prior to Visit  Medication Sig Dispense Refill   b complex vitamins tablet Take 1 tablet by mouth daily.     Cholecalciferol (VITAMIN D ) 50 MCG (2000 UT) CAPS Take by mouth daily.     MAGNESIUM PO Take by mouth.     metoprolol  succinate (TOPROL -XL) 25 MG 24 hr tablet TAKE 0.5 TABLETS (12.5 MG TOTAL) BY MOUTH DAILY. FOR HEART RATE. 45 tablet 3   Probiotic Product (PROBIOTIC DAILY PO) Take by mouth every other day.      No current facility-administered medications on file prior to visit.    BP 108/68    Pulse 60   Temp 97.9 F (36.6 C) (Oral)   Ht 5' 5.5 (1.664 m)   Wt 166 lb 6 oz (75.5 kg)   SpO2 99%   BMI 27.27 kg/m  Objective:   Physical Exam Exam conducted with a chaperone present.  HENT:     Right Ear: Tympanic membrane and ear canal normal.     Left Ear: Tympanic membrane and ear canal normal.  Eyes:     Pupils: Pupils are equal, round, and reactive to light.  Cardiovascular:     Rate and Rhythm: Normal rate and regular rhythm.  Pulmonary:     Effort: Pulmonary effort is normal.     Breath sounds: Normal breath sounds.  Abdominal:     General: Bowel sounds are normal.     Palpations: Abdomen is soft.     Tenderness: There is no abdominal tenderness.  Genitourinary:    Labia:        Right: No rash, tenderness or lesion.        Left: No rash, tenderness or lesion.      Vagina: Normal.     Cervix: Normal.     Uterus: Normal.      Adnexa: Right adnexa normal and left adnexa normal.  Musculoskeletal:        General: Normal range of motion.     Cervical back: Neck supple.  Skin:    General: Skin is warm and dry.  Neurological:     Mental Status: She is alert and oriented to person, place, and time.     Cranial Nerves: No cranial nerve deficit.     Deep Tendon Reflexes:     Reflex Scores:      Patellar reflexes are 2+ on the right side and 2+ on the left side. Psychiatric:        Mood and Affect: Mood normal.     Physical Exam        Assessment & Plan:  SVT (supraventricular tachycardia) Assessment & Plan: Controlled.  Continue metoprolol  succinate 12.5 mg daily.   Migraine without aura and without status migrainosus, not intractable Assessment & Plan: No concerns today. Continue to monitor.    Routine general medical examination at a health care facility Assessment & Plan: Tetanus updated today. Declines influenza vaccine.  Pap smear due, completed today Mammogram due in January 2026, orders placed. Colonoscopy UTD, due 2033  Discussed  the importance of a healthy diet and regular exercise in order for weight loss, and to reduce the risk of further co-morbidity.  Exam stable. Labs pending.  Follow up in 1  year for repeat physical.    Screening mammogram for breast cancer -     3D Screening Mammogram, Left and Right; Future  Prediabetes Assessment & Plan: Repeat A1C pending.  Work on a healthy diet and regular exercise in order for weight loss, and to reduce the risk of further co-morbidity.    Screening for cervical cancer -     Cytology - PAP    Assessment and Plan Assessment & Plan         Comer MARLA Gaskins, NP    History of Present Illness

## 2024-01-16 NOTE — Assessment & Plan Note (Signed)
Controlled.  Continue metoprolol succinate 12.5 mg daily.

## 2024-01-16 NOTE — Patient Instructions (Signed)
[facility] to schedule your mamm[facility].   It was a pleasure to see you today!   

## 2024-01-18 MED ORDER — METOPROLOL SUCCINATE ER 25 MG PO TB24: 12.5000 mg | ORAL_TABLET | Freq: Every day | ORAL | 3 refills | Status: AC

## 2024-01-24 ENCOUNTER — Ambulatory Visit: Payer: Self-pay | Admitting: Primary Care

## 2024-01-24 LAB — CYTOLOGY - PAP
Comment: NEGATIVE
Diagnosis: NEGATIVE
High risk HPV: NEGATIVE

## 2024-02-26 ENCOUNTER — Encounter: Payer: Self-pay | Admitting: *Deleted

## 2024-04-04 ENCOUNTER — Encounter

## 2025-01-01 ENCOUNTER — Encounter: Admitting: Primary Care
